# Patient Record
Sex: Female | Born: 1935 | Race: Black or African American | Hispanic: No | Marital: Single | Smoking: Former smoker
Health system: Southern US, Community
[De-identification: ages and names within clinical notes are randomized; demographics above are authoritative.]

## PROBLEM LIST (undated history)

## (undated) DIAGNOSIS — E7800 Pure hypercholesterolemia, unspecified: Secondary | ICD-10-CM

## (undated) DIAGNOSIS — R0602 Shortness of breath: Secondary | ICD-10-CM

## (undated) DIAGNOSIS — R768 Other specified abnormal immunological findings in serum: Secondary | ICD-10-CM

## (undated) DIAGNOSIS — Z86010 Personal history of colonic polyps: Secondary | ICD-10-CM

## (undated) DIAGNOSIS — N189 Chronic kidney disease, unspecified: Secondary | ICD-10-CM

## (undated) DIAGNOSIS — M1990 Unspecified osteoarthritis, unspecified site: Secondary | ICD-10-CM

## (undated) DIAGNOSIS — I714 Abdominal aortic aneurysm, without rupture, unspecified: Secondary | ICD-10-CM

## (undated) DIAGNOSIS — Z860101 Personal history of adenomatous and serrated colon polyps: Secondary | ICD-10-CM

## (undated) DIAGNOSIS — H269 Unspecified cataract: Secondary | ICD-10-CM

## (undated) DIAGNOSIS — H409 Unspecified glaucoma: Secondary | ICD-10-CM

## (undated) DIAGNOSIS — K8020 Calculus of gallbladder without cholecystitis without obstruction: Secondary | ICD-10-CM

## (undated) DIAGNOSIS — I10 Essential (primary) hypertension: Secondary | ICD-10-CM

## (undated) DIAGNOSIS — E039 Hypothyroidism, unspecified: Secondary | ICD-10-CM

## (undated) DIAGNOSIS — I6529 Occlusion and stenosis of unspecified carotid artery: Secondary | ICD-10-CM

## (undated) HISTORY — PX: APPENDECTOMY: SHX54

## (undated) HISTORY — PX: RENAL ARTERY STENT: SHX2321

## (undated) HISTORY — DX: Chronic kidney disease, unspecified: N18.9

## (undated) HISTORY — PX: EYE SURGERY: SHX253

## (undated) HISTORY — DX: Occlusion and stenosis of unspecified carotid artery: I65.29

## (undated) HISTORY — PX: COLONOSCOPY: SHX174

---

## 1973-12-05 HISTORY — PX: ABDOMINAL HYSTERECTOMY: SHX81

## 1998-06-17 ENCOUNTER — Ambulatory Visit (HOSPITAL_COMMUNITY): Admission: RE | Admit: 1998-06-17 | Discharge: 1998-06-17 | Payer: Self-pay | Admitting: *Deleted

## 1999-08-13 ENCOUNTER — Other Ambulatory Visit: Admission: RE | Admit: 1999-08-13 | Discharge: 1999-08-13 | Payer: Self-pay | Admitting: Obstetrics and Gynecology

## 2000-05-02 ENCOUNTER — Other Ambulatory Visit: Admission: RE | Admit: 2000-05-02 | Discharge: 2000-05-02 | Payer: Self-pay | Admitting: *Deleted

## 2000-05-12 ENCOUNTER — Encounter: Admission: RE | Admit: 2000-05-12 | Discharge: 2000-05-12 | Payer: Self-pay | Admitting: *Deleted

## 2000-05-12 ENCOUNTER — Encounter: Payer: Self-pay | Admitting: *Deleted

## 2000-05-15 ENCOUNTER — Ambulatory Visit (HOSPITAL_COMMUNITY): Admission: RE | Admit: 2000-05-15 | Discharge: 2000-05-15 | Payer: Self-pay | Admitting: *Deleted

## 2000-06-08 ENCOUNTER — Ambulatory Visit (HOSPITAL_COMMUNITY): Admission: RE | Admit: 2000-06-08 | Discharge: 2000-06-08 | Payer: Self-pay | Admitting: *Deleted

## 2000-06-08 ENCOUNTER — Encounter: Payer: Self-pay | Admitting: *Deleted

## 2001-07-02 ENCOUNTER — Other Ambulatory Visit: Admission: RE | Admit: 2001-07-02 | Discharge: 2001-07-02 | Payer: Self-pay | Admitting: Obstetrics and Gynecology

## 2002-07-22 ENCOUNTER — Encounter: Payer: Self-pay | Admitting: Internal Medicine

## 2002-07-22 ENCOUNTER — Encounter: Admission: RE | Admit: 2002-07-22 | Discharge: 2002-07-22 | Payer: Self-pay | Admitting: Internal Medicine

## 2002-07-25 ENCOUNTER — Other Ambulatory Visit: Admission: RE | Admit: 2002-07-25 | Discharge: 2002-07-25 | Payer: Self-pay | Admitting: Obstetrics and Gynecology

## 2004-07-21 ENCOUNTER — Other Ambulatory Visit: Admission: RE | Admit: 2004-07-21 | Discharge: 2004-07-21 | Payer: Self-pay | Admitting: Obstetrics and Gynecology

## 2005-05-26 ENCOUNTER — Encounter: Admission: RE | Admit: 2005-05-26 | Discharge: 2005-05-26 | Payer: Self-pay | Admitting: Internal Medicine

## 2005-06-18 ENCOUNTER — Emergency Department (HOSPITAL_COMMUNITY): Admission: EM | Admit: 2005-06-18 | Discharge: 2005-06-18 | Payer: Self-pay | Admitting: Emergency Medicine

## 2005-08-03 ENCOUNTER — Other Ambulatory Visit: Admission: RE | Admit: 2005-08-03 | Discharge: 2005-08-03 | Payer: Self-pay | Admitting: Obstetrics and Gynecology

## 2007-01-16 ENCOUNTER — Encounter: Admission: RE | Admit: 2007-01-16 | Discharge: 2007-01-16 | Payer: Self-pay | Admitting: Internal Medicine

## 2007-01-22 ENCOUNTER — Ambulatory Visit: Payer: Self-pay | Admitting: Vascular Surgery

## 2007-03-02 ENCOUNTER — Emergency Department (HOSPITAL_COMMUNITY): Admission: EM | Admit: 2007-03-02 | Discharge: 2007-03-02 | Payer: Self-pay | Admitting: Emergency Medicine

## 2007-03-05 ENCOUNTER — Emergency Department (HOSPITAL_COMMUNITY): Admission: EM | Admit: 2007-03-05 | Discharge: 2007-03-05 | Payer: Self-pay | Admitting: Emergency Medicine

## 2007-05-10 ENCOUNTER — Encounter: Admission: RE | Admit: 2007-05-10 | Discharge: 2007-05-10 | Payer: Self-pay | Admitting: Internal Medicine

## 2007-05-14 ENCOUNTER — Encounter: Admission: RE | Admit: 2007-05-14 | Discharge: 2007-05-14 | Payer: Self-pay | Admitting: Internal Medicine

## 2007-06-28 ENCOUNTER — Other Ambulatory Visit: Admission: RE | Admit: 2007-06-28 | Discharge: 2007-06-28 | Payer: Self-pay | Admitting: Obstetrics and Gynecology

## 2007-07-12 ENCOUNTER — Encounter: Admission: RE | Admit: 2007-07-12 | Discharge: 2007-07-12 | Payer: Self-pay | Admitting: Endocrinology

## 2007-11-14 ENCOUNTER — Encounter: Payer: Self-pay | Admitting: Internal Medicine

## 2007-12-19 ENCOUNTER — Encounter: Admission: RE | Admit: 2007-12-19 | Discharge: 2007-12-19 | Payer: Self-pay | Admitting: Internal Medicine

## 2007-12-27 ENCOUNTER — Encounter: Payer: Self-pay | Admitting: Internal Medicine

## 2007-12-27 ENCOUNTER — Ambulatory Visit: Payer: Self-pay | Admitting: Internal Medicine

## 2007-12-27 ENCOUNTER — Encounter: Admission: RE | Admit: 2007-12-27 | Discharge: 2007-12-27 | Payer: Self-pay | Admitting: Internal Medicine

## 2007-12-27 DIAGNOSIS — R0609 Other forms of dyspnea: Secondary | ICD-10-CM

## 2007-12-27 DIAGNOSIS — R0989 Other specified symptoms and signs involving the circulatory and respiratory systems: Secondary | ICD-10-CM

## 2008-02-25 ENCOUNTER — Ambulatory Visit: Payer: Self-pay | Admitting: Internal Medicine

## 2008-02-25 DIAGNOSIS — J4489 Other specified chronic obstructive pulmonary disease: Secondary | ICD-10-CM | POA: Insufficient documentation

## 2008-02-25 DIAGNOSIS — J449 Chronic obstructive pulmonary disease, unspecified: Secondary | ICD-10-CM

## 2010-06-23 ENCOUNTER — Encounter: Admission: RE | Admit: 2010-06-23 | Discharge: 2010-06-23 | Payer: Self-pay | Admitting: Internal Medicine

## 2011-11-16 ENCOUNTER — Encounter: Payer: Self-pay | Admitting: *Deleted

## 2011-11-16 ENCOUNTER — Emergency Department (HOSPITAL_COMMUNITY)
Admission: EM | Admit: 2011-11-16 | Discharge: 2011-11-16 | Disposition: A | Discharge status: Home / Self Care | Payer: Medicare Other | Attending: Emergency Medicine | Admitting: Emergency Medicine

## 2011-11-16 DIAGNOSIS — Z87891 Personal history of nicotine dependence: Secondary | ICD-10-CM | POA: Insufficient documentation

## 2011-11-16 DIAGNOSIS — I10 Essential (primary) hypertension: Secondary | ICD-10-CM | POA: Insufficient documentation

## 2011-11-16 DIAGNOSIS — Z9079 Acquired absence of other genital organ(s): Secondary | ICD-10-CM | POA: Insufficient documentation

## 2011-11-16 DIAGNOSIS — H9190 Unspecified hearing loss, unspecified ear: Secondary | ICD-10-CM | POA: Insufficient documentation

## 2011-11-16 HISTORY — DX: Essential (primary) hypertension: I10

## 2011-11-16 MED ORDER — AMOXICILLIN 500 MG PO CAPS: 500.0000 mg | ORAL_CAPSULE | Freq: Three times a day (TID) | ORAL | Status: AC

## 2011-11-16 NOTE — ED Provider Notes (Signed)
This chart was scribed for No att. providers found by [place]. The patient was seen in room APA07/APA07 and the patient's care was started at 9:50 AM.   CSN: [id number] Arrival date & time: 11/16/2011  9:13 AM   First MD Initiated Contact with Patient 11/16/11 [phone]      Chief Complaint  Patient presents with  . Hearing Loss     HPI Rebecca Tate is a 75 y.o. female who presents to the Emergency Department complaining of sudden onset hearing loss in both ears that began several days ago after she had a cold.  Pt states that the hearing loss is worse in her left ear.  Pt reports that she can hear but sounds are muffled.  Pt c/o associated dizziness. Pt denies drainage from ear, denies trauma to ears, denies vision changes.     Past Medical History  Diagnosis Date  . Hypertension     Past Surgical History  Procedure Date  . Abdominal hysterectomy     partial    History reviewed. No pertinent family history.  History  Substance Use Topics  . Smoking status: Former Games developer  . Smokeless tobacco: Not on file  . Alcohol Use: No    OB History    Grav Para Term Preterm Abortions TAB SAB Ect Mult Living                  Review of Systems 10 Systems reviewed and are negative for acute change except as noted in the HPI.  Allergies  Penicillins  Home Medications   Current Outpatient Rx  Name Route Sig Dispense Refill  . AMLODIPINE BESYLATE 10 MG PO TABS Oral Take 10 mg by mouth daily.      . ASPIRIN 81 MG PO TBEC Oral Take 81 mg by mouth daily. Swallow whole.     Marland Kitchen HYDRALAZINE HCL 25 MG PO TABS Oral Take 25 mg by mouth 2 (two) times daily.      Marland Kitchen LEVOTHYROXINE SODIUM 75 MCG PO TABS Oral Take 75 mcg by mouth daily.        BP 161/61  Pulse 77  Temp(Src) 98.7 F (37.1 C) (Oral)  Resp 18  Ht 5\' 4"  (1.626 m)  Wt 148 lb (67.132 kg)  BMI 25.40 kg/m2  SpO2 100%  Physical Exam CONSTITUTIONAL: Well developed/well nourished HEAD AND FACE:  Normocephalic/atraumatic EYES: EOMI/PERRL ENMT: Mucous membranes moist, bilateral TMs with small effusion noted, TM intact.   NECK: supple no meningeal signs CV: S1/S2 noted, no murmurs/rubs/gallops noted LUNGS: Lungs are clear to auscultation bilaterally, no apparent distress ABDOMEN: soft, nontender, no rebound or guarding GU:no cva tenderness NEURO: Pt is awake/alert, moves all extremitiesx4, facies symmetric EXTREMITIES: pulses normal, full ROM SKIN: warm, color normal PSYCH: no abnormalities of mood noted  ED Course  Procedures  DIAGNOSTIC STUDIES: Oxygen Saturation is 100% on room air, normal by my interpretation.    COORDINATION OF CARE:  Pt well appearing, recent cough, now with muffled hearing suspect has serous otitis media  MDM  Nursing notes reviewed and considered in documentation   I personally performed the services described in this documentation, which was scribed in my presence. The recorded information has been reviewed and considered.         Joya Gaskins, MD 11/16/11 [phone]

## 2011-11-16 NOTE — ED Notes (Signed)
Pt states she started out with a cold a while back and since then she has had some difficulty hearing in both ears; pt denies any pain

## 2012-01-26 ENCOUNTER — Other Ambulatory Visit: Payer: Self-pay | Admitting: Internal Medicine

## 2012-01-26 DIAGNOSIS — Z1231 Encounter for screening mammogram for malignant neoplasm of breast: Secondary | ICD-10-CM

## 2012-02-09 ENCOUNTER — Ambulatory Visit
Admission: RE | Admit: 2012-02-09 | Discharge: 2012-02-09 | Disposition: A | Discharge status: Home / Self Care | Payer: Medicare Other | Source: Ambulatory Visit | Attending: Internal Medicine | Admitting: Internal Medicine

## 2012-02-09 DIAGNOSIS — Z1231 Encounter for screening mammogram for malignant neoplasm of breast: Secondary | ICD-10-CM

## 2012-03-08 ENCOUNTER — Other Ambulatory Visit: Payer: Self-pay | Admitting: Internal Medicine

## 2012-03-09 ENCOUNTER — Other Ambulatory Visit: Payer: Medicare Other

## 2012-03-14 ENCOUNTER — Ambulatory Visit
Admission: RE | Admit: 2012-03-14 | Discharge: 2012-03-14 | Disposition: A | Discharge status: Home / Self Care | Payer: Medicare Other | Source: Ambulatory Visit | Attending: Internal Medicine | Admitting: Internal Medicine

## 2012-06-19 ENCOUNTER — Encounter (HOSPITAL_COMMUNITY): Payer: Self-pay | Admitting: *Deleted

## 2012-06-20 ENCOUNTER — Ambulatory Visit (HOSPITAL_COMMUNITY)
Admission: RE | Admit: 2012-06-20 | Discharge: 2012-06-20 | Disposition: A | Discharge status: Home / Self Care | Payer: Medicare Other | Source: Ambulatory Visit | Attending: Gastroenterology | Admitting: Gastroenterology

## 2012-06-20 ENCOUNTER — Encounter (HOSPITAL_COMMUNITY): Payer: Self-pay | Admitting: Anesthesiology

## 2012-06-20 ENCOUNTER — Ambulatory Visit (HOSPITAL_COMMUNITY): Payer: Medicare Other | Admitting: Anesthesiology

## 2012-06-20 ENCOUNTER — Encounter (HOSPITAL_COMMUNITY)
Admission: RE | Disposition: A | Discharge status: Home / Self Care | Payer: Self-pay | Source: Ambulatory Visit | Attending: Gastroenterology

## 2012-06-20 DIAGNOSIS — K8020 Calculus of gallbladder without cholecystitis without obstruction: Secondary | ICD-10-CM | POA: Insufficient documentation

## 2012-06-20 DIAGNOSIS — E039 Hypothyroidism, unspecified: Secondary | ICD-10-CM | POA: Insufficient documentation

## 2012-06-20 DIAGNOSIS — R110 Nausea: Secondary | ICD-10-CM | POA: Insufficient documentation

## 2012-06-20 DIAGNOSIS — J449 Chronic obstructive pulmonary disease, unspecified: Secondary | ICD-10-CM | POA: Insufficient documentation

## 2012-06-20 DIAGNOSIS — I10 Essential (primary) hypertension: Secondary | ICD-10-CM | POA: Insufficient documentation

## 2012-06-20 DIAGNOSIS — J4489 Other specified chronic obstructive pulmonary disease: Secondary | ICD-10-CM | POA: Insufficient documentation

## 2012-06-20 DIAGNOSIS — R7989 Other specified abnormal findings of blood chemistry: Secondary | ICD-10-CM | POA: Insufficient documentation

## 2012-06-20 HISTORY — PX: EUS: SHX5427

## 2012-06-20 HISTORY — DX: Unspecified glaucoma: H40.9

## 2012-06-20 HISTORY — DX: Hypothyroidism, unspecified: E03.9

## 2012-06-20 HISTORY — DX: Other specified abnormal immunological findings in serum: R76.8

## 2012-06-20 HISTORY — DX: Personal history of colonic polyps: Z86.010

## 2012-06-20 HISTORY — DX: Abdominal aortic aneurysm, without rupture: I71.4

## 2012-06-20 HISTORY — DX: Personal history of adenomatous and serrated colon polyps: Z86.0101

## 2012-06-20 HISTORY — DX: Calculus of gallbladder without cholecystitis without obstruction: K80.20

## 2012-06-20 HISTORY — DX: Pure hypercholesterolemia, unspecified: E78.00

## 2012-06-20 HISTORY — DX: Abdominal aortic aneurysm, without rupture, unspecified: I71.40

## 2012-06-20 HISTORY — DX: Unspecified cataract: H26.9

## 2012-06-20 SURGERY — ESOPHAGEAL ENDOSCOPIC ULTRASOUND (EUS) RADIAL
Anesthesia: Monitor Anesthesia Care

## 2012-06-20 MED ORDER — MIDAZOLAM HCL 5 MG/5ML IJ SOLN
INTRAMUSCULAR | Status: DC | PRN
  Administered 2012-06-20: 1 mg via INTRAVENOUS

## 2012-06-20 MED ORDER — SODIUM CHLORIDE 0.9 % IV SOLN: Freq: Once | INTRAVENOUS | Status: DC

## 2012-06-20 MED ORDER — PROPOFOL 10 MG/ML IV EMUL
INTRAVENOUS | Status: DC | PRN
  Administered 2012-06-20: 160 ug/kg/min via INTRAVENOUS

## 2012-06-20 MED ORDER — LACTATED RINGERS IV SOLN
INTRAVENOUS | Status: DC
  Administered 2012-06-20: 1000 mL via INTRAVENOUS

## 2012-06-20 MED ORDER — FENTANYL CITRATE 0.05 MG/ML IJ SOLN
INTRAMUSCULAR | Status: DC | PRN
  Administered 2012-06-20 (×2): 25 ug via INTRAVENOUS

## 2012-06-20 MED ORDER — ONDANSETRON HCL 4 MG/2ML IJ SOLN
INTRAMUSCULAR | Status: DC | PRN
  Administered 2012-06-20: 4 mg via INTRAVENOUS

## 2012-06-20 MED ORDER — LIDOCAINE HCL (CARDIAC) 20 MG/ML IV SOLN
INTRAVENOUS | Status: DC | PRN
  Administered 2012-06-20: 50 mg via INTRAVENOUS

## 2012-06-20 MED ORDER — KETAMINE HCL 50 MG/ML IJ SOLN
INTRAMUSCULAR | Status: DC | PRN
  Administered 2012-06-20 (×4): 5 mg via INTRAMUSCULAR

## 2012-06-20 NOTE — Preoperative (Signed)
Beta Blockers   Reason not to administer Beta Blockers:Not Applicable, not on home BB 

## 2012-06-20 NOTE — Progress Notes (Signed)
Pt calm and appropriate.  VSS.  Sister at bedside.

## 2012-06-20 NOTE — H&P (Signed)
Patient interval history reviewed.  Patient examined again.  There has been no change from documented H/P dated 05/31/12 (scanned into chart from our office) except as documented above.  Assessment:  Elevated LFTs. Gallstones. Transabdominal ultrasound recently with gallstones and non-dilated biliary tree.  Plan:  1.  Endoscopic ultrasound to exclude choledocholithiasis or other source of elevated LFTs. 2.  Risks (bleeding, infection, bowel perforation that could require surgery, sedation-related changes in cardiopulmonary systems), benefits (identification and possible treatment of source of symptoms, exclusion of certain causes of symptoms), and alternatives (watchful waiting, radiographic imaging studies, empiric medical treatment) of upper endoscopy with ultrasound (EUS) were explained to patient in detail and she elects  to proceed.

## 2012-06-20 NOTE — Progress Notes (Signed)
Pt arrived in recovery.  Denying pain, but moaning out.  Adjusted in bed.  Maintaining airway and stable vital signs.  CRNA stated she began moaning with initial sedating medications during procedure.  Able to verbalize appropriately to simple questions.  Moving extremities purposefully.  Keeps eyes closed and moans.

## 2012-06-20 NOTE — Anesthesia Preprocedure Evaluation (Addendum)
Anesthesia Evaluation  Patient identified by MRN, date of birth, ID band Patient awake    Reviewed: Allergy & Precautions, H&P , NPO status , Patient's Chart, lab work & pertinent test results  Airway Mallampati: II TM Distance: >3 FB Neck ROM: full    Dental  (+) Edentulous Upper and Dental Advisory Given   Pulmonary neg pulmonary ROS, shortness of breath and with exertion, COPD Mild COPD breath sounds clear to auscultation  Pulmonary exam normal       Cardiovascular Exercise Tolerance: Good hypertension, Pt. on medications negative cardio ROS  Rhythm:regular Rate:Normal  AAA 3.9 cm   Neuro/Psych negative neurological ROS  negative psych ROS   GI/Hepatic negative GI ROS, Neg liver ROS,   Endo/Other  negative endocrine ROSHypothyroidism   Renal/GU negative Renal ROS  negative genitourinary   Musculoskeletal   Abdominal   Peds  Hematology negative hematology ROS (+)   Anesthesia Other Findings   Reproductive/Obstetrics negative OB ROS                          Anesthesia Physical Anesthesia Plan  ASA: III  Anesthesia Plan: MAC   Post-op Pain Management:    Induction:   Airway Management Planned: Simple Face Mask  Additional Equipment:   Intra-op Plan:   Post-operative Plan:   Informed Consent: I have reviewed the patients History and Physical, chart, labs and discussed the procedure including the risks, benefits and alternatives for the proposed anesthesia with the patient or authorized representative who has indicated his/her understanding and acceptance.   Dental Advisory Given  Plan Discussed with: CRNA and Surgeon  Anesthesia Plan Comments:         Anesthesia Quick Evaluation

## 2012-06-20 NOTE — Op Note (Signed)
[facility] [address] [place], [place]  [address]  ENDOSCOPIC ULTRASOUND PROCEDURE REPORT  PATIENT:  Rebecca, Tate  MR#:  [id number] BIRTHDATE:  1936/10/13  GENDER:  female  ENDOSCOPIST:  Willis Modena, MD REFERRED BY:  Danise Edge, M.D.  PROCEDURE DATE:  06/20/2012 PROCEDURE:  Upper EUS ASA CLASS:  Class III INDICATIONS:  elevated LFTs, nausea  MEDICATIONS:   Cetacaine spray x 2, MAC sedation, administered by CRNA  DESCRIPTION OF PROCEDURE:   After the risks benefits and alternatives of the procedure were  explained, informed consent was obtained. The patient was then placed in the left, lateral, decubitus postion and IV sedation was administered. Throughout the procedure, the patient's blood pressure, pulse and oxygen saturations were monitored continuously.  Under direct visualization, the 110036 endoscope was introduced through the mouth and advanced to the second portion of the duodenum.  Water was used as necessary to provide an acoustic interface.  Upon completion of the imaging, water was removed and the patient was sent to the recovery room in satisfactory condition.  <<PROCEDUREIMAGES>>  FINDINGS:  Scattered hyperechoic strands and foci, otherwise normal pancreas; no mass, cyst or adenopathy  appreciated.  Bile duct normal caliber without bile duct stones or wall thickening. Gallbladder with some sludge, small stones, and a large stone   2.5 cm in diameter.  Abdominal aortic aneurysm appreciated and obscured some views of uncinate pancreas.  ENDOSCOPIC IMPRESSION:    1.  No pancreatic or biliary tract source of elevated LFTs identified. 2.  Incidental findings as above.  RECOMMENDATIONS:      1.  Watch for potential complications of procedure. 2.  Follow-up with Dr. Danise Edge for consideration of liver biopsy as next step in evaluation             of patient's elevated liver tests.  ______________________________ Willis Modena  CC:  n. eSIGNEDWillis Modena at 06/20/2012 02:05 PM  Ruben Gottron, [phone]

## 2012-06-20 NOTE — Transfer of Care (Signed)
Immediate Anesthesia Transfer of Care Note  Patient: Rebecca Tate  Procedure(s) Performed: Procedure(s) (LRB): ESOPHAGEAL ENDOSCOPIC ULTRASOUND (EUS) RADIAL (N/A)  Patient Location: PACU and Endoscopy Unit  Anesthesia Type: MAC  Level of Consciousness: awake, patient cooperative and responds to stimulation  Airway & Oxygen Therapy: Patient Spontanous Breathing and Patient connected to face mask  Post-op Assessment: Report given to PACU RN, Post -op Vital signs reviewed and stable and Patient moving all extremities  Post vital signs: Reviewed and stable  Complications: No apparent anesthesia complications

## 2012-06-22 NOTE — Anesthesia Postprocedure Evaluation (Signed)
  Anesthesia Post-op Note  Patient: Rebecca Tate  Procedure(s) Performed: Procedure(s) (LRB): ESOPHAGEAL ENDOSCOPIC ULTRASOUND (EUS) RADIAL (N/A)  Patient Location: PACU  Anesthesia Type: MAC  Level of Consciousness: awake and alert   Airway and Oxygen Therapy: Patient Spontanous Breathing  Post-op Pain: mild  Post-op Assessment: Post-op Vital signs reviewed, Patient's Cardiovascular Status Stable, Respiratory Function Stable, Patent Airway and No signs of Nausea or vomiting  Post-op Vital Signs: stable  Complications: No apparent anesthesia complications

## 2012-06-25 ENCOUNTER — Encounter (HOSPITAL_COMMUNITY): Payer: Self-pay | Admitting: Gastroenterology

## 2012-08-10 ENCOUNTER — Other Ambulatory Visit: Payer: Self-pay

## 2012-08-10 DIAGNOSIS — G458 Other transient cerebral ischemic attacks and related syndromes: Secondary | ICD-10-CM

## 2012-08-10 DIAGNOSIS — R0989 Other specified symptoms and signs involving the circulatory and respiratory systems: Secondary | ICD-10-CM

## 2012-08-10 DIAGNOSIS — N3030 Trigonitis without hematuria: Secondary | ICD-10-CM

## 2012-08-10 DIAGNOSIS — I10 Essential (primary) hypertension: Secondary | ICD-10-CM

## 2012-08-31 ENCOUNTER — Encounter: Payer: Self-pay | Admitting: Surgery

## 2012-09-03 ENCOUNTER — Other Ambulatory Visit (INDEPENDENT_AMBULATORY_CARE_PROVIDER_SITE_OTHER): Payer: Medicare Other | Admitting: *Deleted

## 2012-09-03 ENCOUNTER — Encounter (INDEPENDENT_AMBULATORY_CARE_PROVIDER_SITE_OTHER): Payer: Medicare Other | Admitting: *Deleted

## 2012-09-03 ENCOUNTER — Encounter: Payer: Self-pay | Admitting: Surgery

## 2012-09-03 ENCOUNTER — Ambulatory Visit (INDEPENDENT_AMBULATORY_CARE_PROVIDER_SITE_OTHER): Payer: Medicare Other | Admitting: Surgery

## 2012-09-03 ENCOUNTER — Other Ambulatory Visit: Payer: Self-pay

## 2012-09-03 VITALS — BP 90/65 | HR 60 | Resp 16 | Ht 64.5 in | Wt 138.8 lb

## 2012-09-03 DIAGNOSIS — G458 Other transient cerebral ischemic attacks and related syndromes: Secondary | ICD-10-CM

## 2012-09-03 DIAGNOSIS — I10 Essential (primary) hypertension: Secondary | ICD-10-CM

## 2012-09-03 DIAGNOSIS — R0989 Other specified symptoms and signs involving the circulatory and respiratory systems: Secondary | ICD-10-CM

## 2012-09-03 NOTE — Progress Notes (Signed)
[facility] [place]   Patient name: Rebecca Tate MRN: [id number] DOB: 01/08/1936 Sex: female   Referred by: Dr. Coladonato  Reason for referral:  Chief Complaint  Patient presents with  . New Evaluation    Renal Artery , Carotid Bruit and Left Femoral Bruit. Ref. by Dr. J. Coladonato    HISTORY OF PRESENT ILLNESS: The patient comes in today for evaluation of multiple bruits, and a vascular evaluation. She has a non-small abdominal aortic aneurysm. She has not left subclavian artery stenosis which is asymptomatic. She does have a discrepancy between the blood pressures in her right and left arm. During a recent examination she is found to have carotid abdominal bruits. She denies any neurovascular symptoms. Specifically, she denies numbness or weakness in either extremity. She denies slurred speech. She denies amaurosis fugax. She does have a history of hypertension and renal insufficiency. She has a history of tobacco abuse but quit 3-1/2 years ago.  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  Past Medical History  Diagnosis Date  . Hypertension   . Hypothyroidism     thyroid nodule  . Hypercholesteremia   . Cataracts, bilateral   . Hx of adenomatous colonic polyps   . AAA (abdominal aortic aneurysm)   . Gallstones   . Glaucoma   . ANA positive   . Carotid artery occlusion   . Chronic kidney disease     Past Surgical History  Procedure Date  . Appendectomy   . Colonoscopy   . Eus 06/20/2012    Procedure: ESOPHAGEAL ENDOSCOPIC ULTRASOUND (EUS) RADIAL;  Surgeon: William Outlaw, MD;  Location: [facility] ENDOSCOPY;  Service: Endoscopy;  Laterality: N/A;  . Abdominal hysterectomy 1975    partial    History   Social History  . Marital Status: Single    Spouse Name: N/A    Number of Children: N/A  . Years of Education: N/A   Occupational History  . Not on file.   Social History Main Topics  . Smoking status: Former  Smoker -- 0.5 packs/day    Types: Cigarettes    Quit date: 12/05/2008  . Smokeless tobacco: Not on file  . Alcohol Use: No  . Drug Use: No  . Sexually Active:    Other Topics Concern  . Not on file   Social History Narrative  . No narrative on file    Family History  Problem Relation Age of Onset  . Heart disease Mother   . Hypertension Mother   . Heart attack Father   . Diabetes Sister   . Hypertension Sister   . Diabetes Brother     Amputation  . Heart disease Brother   . Hypertension Brother     Allergies as of 09/03/2012 - Review Complete 09/03/2012  Allergen Reaction Noted  . Hydralazine Nausea And Vomiting 09/03/2012  . Penicillins      Current Outpatient Prescriptions on File Prior to Visit  Medication Sig Dispense Refill  . amLODipine (NORVASC) 10 MG tablet Take 10 mg by mouth daily.        . aspirin (ASPIRIN EC) 81 MG EC tablet Take 81 mg by mouth daily. Swallow whole.       . cholecalciferol (VITAMIN D) 1000 UNITS tablet Take 1,000 Units by mouth daily.      . latanoprost (XALATAN) 0.005 % ophthalmic solution 1 drop at bedtime.      . levothyroxine (SYNTHROID, LEVOTHROID) 75 MCG tablet Take 75 mcg by mouth   daily.        . metoprolol succinate (TOPROL-XL) 25 MG 24 hr tablet       . hydrALAZINE (APRESOLINE) 25 MG tablet Take 25 mg by mouth 2 (two) times daily.           REVIEW OF SYSTEMS: Cardiovascular: No chest pain, chest pressure, palpitations,No claudication or rest pain,  No history of DVT or phlebitis. She does get cramps in her left leg at night, and she has leg swelling Pulmonary: No productive cough, asthma or wheezing. Positive for shortness of breath with exertion. Neurologic: No weakness, paresthesias, aphasia, or amaurosis. No dizziness. Hematologic: No bleeding problems or clotting disorders. Musculoskeletal: No joint pain or joint swelling. Gastrointestinal: No blood in stool or hematemesis Genitourinary: No dysuria or  hematuria. Psychiatric:: No history of major depression. Integumentary: No rashes or ulcers. Constitutional: No fever or chills.  PHYSICAL EXAMINATION: General: The patient appears their stated age.  Vital signs are BP 90/65  Pulse 60  Resp 16  Ht 5' 4.5" (1.638 m)  Wt 138 lb 12.8 oz (62.959 kg)  BMI 23.46 kg/m2  SpO2 100% HEENT:  No gross abnormalities Pulmonary: Respirations are non-labored Abdomen: Soft and non-tender. Musculoskeletal: There are no major deformities.   Neurologic: No focal weakness or paresthesias are detected, Skin: There are no ulcer or rashes noted. Psychiatric: The patient has normal affect. Cardiovascular: There is a regular rate and rhythm without significant murmur appreciated. Palpable femoral pulses bilaterally. I can palpate a left posterior tibial. Faint carotid bruits bilaterally  Diagnostic Studies: Carotid ultrasound: 1-39% right carotid stenosis, 40-59% left carotid stenosis. Significant blood pressure gradient. The left was 160, the right was 91. The vertebral artery on the left could not be visualized.  Abdominal ultrasound: Suspect greater than 60% stenosis of the left renal artery. Resistive indices could not be calculated due to 2 poor visualization of the profunda renal parenchyma. The right kidney measures 7.9 cm. The left kidney measures 8.98 cm. The patient has a 3.7 cm abdominal aortic aneurysm. There are elevated velocities within the right common iliac artery. Peak systolic velocity was 389.  Ankle-brachial indices: 1.0 on the left with triphasic waveforms. 0.84 on the right with biphasic waveforms  Outside Studies/Documentation Historical records were reviewed.  They showed multiple bruits  Assessment:  #1: Abdominal aortic aneurysm #2: Peripheral vascular disease #3: Renal artery stenosis #4: carotid artery stenosis  Plan: #1: Abdominal aortic aneurysm - continue to follow this on a yearly basis. I discussed with the patient at  this size there is a small risk of rupture, but unlikely. #2: Peripheral vascular disease - the patient does have right common iliac stenosis, however this appears to be asymptomatic. I discussed the signs and symptoms of claudication. Should the patient develop these we could address this at a later date. #3: Renal artery stenosis  - the ultrasound today was limited do to the difficulty in evaluating her kidneys. However, there is a suspected left renal artery stenosis. It is not clear at this time as to whether this is in the midportion of the renal artery, out in the hilum or at the ostium. Because of the patient's worsening renal function and hypertension I feel it is reasonable to proceed with angiographic evaluation and possible intervention should a significant stenosis be identified. Because of her elevated creatinine I will to the study with CO2. I potentially did have to use a small amount of dye, 10-15 mL. This is been scheduled for Tuesday, October 15.   I will start bicarbonate upon her arrival the morning of her procedure. I also told patient that she may require an overnight admission following her procedure depending on amount of contrast utilized. #4: Carotid artery stenosis - The patient is asymptomatic. I'll follow her carotid artery stenosis with ultrasound in one year.     V. Wells Stephon Weathers IV, M.D. [facility] [place] Office: [phone] Pager:  [phone]   

## 2012-09-07 ENCOUNTER — Encounter (HOSPITAL_COMMUNITY): Payer: Self-pay | Admitting: Pharmacy Technician

## 2012-09-17 MED ORDER — SODIUM CHLORIDE 0.9 % IV SOLN: INTRAVENOUS | Status: DC

## 2012-09-18 ENCOUNTER — Ambulatory Visit (HOSPITAL_COMMUNITY)
Admission: RE | Admit: 2012-09-18 | Discharge: 2012-09-19 | Disposition: A | Discharge status: Home / Self Care | Payer: Medicare Other | Source: Ambulatory Visit | Attending: Surgery | Admitting: Surgery

## 2012-09-18 ENCOUNTER — Encounter (HOSPITAL_COMMUNITY)
Admission: RE | Disposition: A | Discharge status: Home / Self Care | Payer: Self-pay | Source: Ambulatory Visit | Attending: Surgery

## 2012-09-18 DIAGNOSIS — Z860100 Personal history of colon polyps, unspecified: Secondary | ICD-10-CM | POA: Insufficient documentation

## 2012-09-18 DIAGNOSIS — E785 Hyperlipidemia, unspecified: Secondary | ICD-10-CM | POA: Insufficient documentation

## 2012-09-18 DIAGNOSIS — I714 Abdominal aortic aneurysm, without rupture, unspecified: Secondary | ICD-10-CM | POA: Insufficient documentation

## 2012-09-18 DIAGNOSIS — N189 Chronic kidney disease, unspecified: Secondary | ICD-10-CM | POA: Insufficient documentation

## 2012-09-18 DIAGNOSIS — R0989 Other specified symptoms and signs involving the circulatory and respiratory systems: Secondary | ICD-10-CM

## 2012-09-18 DIAGNOSIS — H409 Unspecified glaucoma: Secondary | ICD-10-CM | POA: Insufficient documentation

## 2012-09-18 DIAGNOSIS — H269 Unspecified cataract: Secondary | ICD-10-CM | POA: Insufficient documentation

## 2012-09-18 DIAGNOSIS — I701 Atherosclerosis of renal artery: Secondary | ICD-10-CM

## 2012-09-18 DIAGNOSIS — Z7982 Long term (current) use of aspirin: Secondary | ICD-10-CM | POA: Insufficient documentation

## 2012-09-18 DIAGNOSIS — E039 Hypothyroidism, unspecified: Secondary | ICD-10-CM | POA: Insufficient documentation

## 2012-09-18 DIAGNOSIS — I6529 Occlusion and stenosis of unspecified carotid artery: Secondary | ICD-10-CM | POA: Insufficient documentation

## 2012-09-18 DIAGNOSIS — I708 Atherosclerosis of other arteries: Secondary | ICD-10-CM | POA: Insufficient documentation

## 2012-09-18 DIAGNOSIS — K8020 Calculus of gallbladder without cholecystitis without obstruction: Secondary | ICD-10-CM | POA: Insufficient documentation

## 2012-09-18 DIAGNOSIS — Z79899 Other long term (current) drug therapy: Secondary | ICD-10-CM | POA: Insufficient documentation

## 2012-09-18 DIAGNOSIS — I129 Hypertensive chronic kidney disease with stage 1 through stage 4 chronic kidney disease, or unspecified chronic kidney disease: Secondary | ICD-10-CM | POA: Insufficient documentation

## 2012-09-18 HISTORY — PX: RENAL ANGIOGRAM: SHX5509

## 2012-09-18 LAB — POCT ACTIVATED CLOTTING TIME
Activated Clotting Time: 199 seconds
Activated Clotting Time: 179 seconds

## 2012-09-18 LAB — POCT I-STAT, CHEM 8
Calcium, Ion: 1.21 mmol/L (ref 1.13–1.30)
Chloride: 113 mEq/L — ABNORMAL HIGH (ref 96–112)
HCT: 43 % (ref 36.0–46.0)
Hemoglobin: 14.6 g/dL (ref 12.0–15.0)
TCO2: 24 mmol/L (ref 0–100)

## 2012-09-18 SURGERY — RENAL ANGIOGRAM
Anesthesia: LOCAL

## 2012-09-18 MED ORDER — AMLODIPINE BESYLATE 10 MG PO TABS
10.0000 mg | ORAL_TABLET | Freq: Every day | ORAL | Status: DC
  Filled 2012-09-18 (×2): qty 1

## 2012-09-18 MED ORDER — ACETAMINOPHEN 325 MG PO TABS: 325.0000 mg | ORAL_TABLET | ORAL | Status: DC | PRN

## 2012-09-18 MED ORDER — HEPARIN SODIUM (PORCINE) 1000 UNIT/ML IJ SOLN
INTRAMUSCULAR | Status: AC
  Filled 2012-09-18: qty 1

## 2012-09-18 MED ORDER — HEPARIN (PORCINE) IN NACL 2-0.9 UNIT/ML-% IJ SOLN
INTRAMUSCULAR | Status: AC
  Filled 2012-09-18: qty 500

## 2012-09-18 MED ORDER — DEXTROSE 5 % IV SOLN
INTRAVENOUS | Status: DC
  Filled 2012-09-18: qty 1000

## 2012-09-18 MED ORDER — LIDOCAINE HCL (PF) 1 % IJ SOLN
INTRAMUSCULAR | Status: AC
  Filled 2012-09-18: qty 30

## 2012-09-18 MED ORDER — SODIUM BICARBONATE 8.4 % IV SOLN
INTRAVENOUS | Status: DC
  Filled 2012-09-18: qty 1000

## 2012-09-18 MED ORDER — OXYCODONE-ACETAMINOPHEN 5-325 MG PO TABS: 1.0000 | ORAL_TABLET | ORAL | Status: DC | PRN

## 2012-09-18 MED ORDER — METOPROLOL TARTRATE 1 MG/ML IV SOLN: 2.0000 mg | INTRAVENOUS | Status: DC | PRN

## 2012-09-18 MED ORDER — METOPROLOL SUCCINATE ER 25 MG PO TB24
25.0000 mg | ORAL_TABLET | Freq: Every day | ORAL | Status: DC
  Filled 2012-09-18: qty 1

## 2012-09-18 MED ORDER — ASPIRIN EC 81 MG PO TBEC
81.0000 mg | DELAYED_RELEASE_TABLET | Freq: Every day | ORAL | Status: DC
  Administered 2012-09-18 – 2012-09-19 (×2): 81 mg via ORAL
  Filled 2012-09-18 (×2): qty 1

## 2012-09-18 MED ORDER — PHENOL 1.4 % MT LIQD
1.0000 | OROMUCOSAL | Status: DC | PRN
  Filled 2012-09-18: qty 177

## 2012-09-18 MED ORDER — ALUM & MAG HYDROXIDE-SIMETH 200-200-20 MG/5ML PO SUSP: 15.0000 mL | ORAL | Status: DC | PRN

## 2012-09-18 MED ORDER — ONDANSETRON HCL 4 MG/2ML IJ SOLN: 4.0000 mg | Freq: Four times a day (QID) | INTRAMUSCULAR | Status: DC | PRN

## 2012-09-18 MED ORDER — CLONIDINE HCL 0.2 MG PO TABS
0.2000 mg | ORAL_TABLET | ORAL | Status: DC | PRN
Start: 2012-09-18 — End: 2012-09-19
  Filled 2012-09-18: qty 1

## 2012-09-18 MED ORDER — FENTANYL CITRATE 0.05 MG/ML IJ SOLN
INTRAMUSCULAR | Status: AC
  Filled 2012-09-18: qty 2

## 2012-09-18 MED ORDER — LEVOTHYROXINE SODIUM 75 MCG PO TABS
75.0000 ug | ORAL_TABLET | Freq: Every day | ORAL | Status: DC
  Filled 2012-09-18 (×2): qty 1

## 2012-09-18 MED ORDER — FENTANYL CITRATE 0.05 MG/ML IJ SOLN: 25.0000 ug | INTRAMUSCULAR | Status: DC | PRN

## 2012-09-18 MED ORDER — VITAMIN D3 25 MCG (1000 UNIT) PO TABS
1000.0000 [IU] | ORAL_TABLET | Freq: Every day | ORAL | Status: DC
  Filled 2012-09-18: qty 1

## 2012-09-18 MED ORDER — ONDANSETRON HCL 4 MG/2ML IJ SOLN
INTRAMUSCULAR | Status: AC
  Filled 2012-09-18: qty 2

## 2012-09-18 MED ORDER — SODIUM CHLORIDE 0.9 % IV SOLN: 1.0000 mL/kg/h | INTRAVENOUS | Status: DC

## 2012-09-18 MED ORDER — MORPHINE SULFATE 2 MG/ML IJ SOLN: 2.0000 mg | INTRAMUSCULAR | Status: DC | PRN

## 2012-09-18 MED ORDER — SODIUM CHLORIDE 0.9 % IV SOLN
INTRAVENOUS | Status: DC
  Administered 2012-09-18: 20:00:00 via INTRAVENOUS

## 2012-09-18 MED ORDER — LACTATED RINGERS IV SOLN: INTRAVENOUS | Status: DC

## 2012-09-18 MED ORDER — EZETIMIBE 10 MG PO TABS
10.0000 mg | ORAL_TABLET | Freq: Every day | ORAL | Status: DC
  Filled 2012-09-18: qty 1

## 2012-09-18 MED ORDER — ACETAMINOPHEN 650 MG RE SUPP: 325.0000 mg | RECTAL | Status: DC | PRN

## 2012-09-18 MED ORDER — GUAIFENESIN-DM 100-10 MG/5ML PO SYRP: 15.0000 mL | ORAL_SOLUTION | ORAL | Status: DC | PRN

## 2012-09-18 MED ORDER — MIDAZOLAM HCL 2 MG/2ML IJ SOLN
INTRAMUSCULAR | Status: AC
  Filled 2012-09-18: qty 2

## 2012-09-18 MED ORDER — DORZOLAMIDE HCL 2 % OP SOLN
1.0000 [drp] | Freq: Two times a day (BID) | OPHTHALMIC | Status: DC
  Filled 2012-09-18: qty 10

## 2012-09-18 MED ORDER — SODIUM BICARBONATE 8.4 % IV SOLN
INTRAVENOUS | Status: DC
  Filled 2012-09-18 (×2): qty 1000

## 2012-09-18 NOTE — Op Note (Signed)
[facility]  Patient name: Rebecca Tate MRN: [id number] DOB: 02/01/1936 Sex: female  09/18/2012 Pre-operative Diagnosis: Renal artery stenosis Post-operative diagnosis:  Same Surgeon:  Jorge Ny Procedure Performed:  1.  ultrasound access right femoral artery  2.  abdominal aortogram  3.  first order catheterization (left renal artery)  4.  left renal artery angiogram     Indications:  The patient was referred by nephrology for worsening renal function and hypertension. She has multiple abdominal bruits on examination. Ultrasound identified what was thought to be a stenosis in the left renal artery. She comes in today for further evaluation and possible treatment  Procedure:  The patient was identified in the holding area and taken to room 8.  The patient was then placed supine on the table and prepped and draped in the usual sterile fashion.  A time out was called.  Ultrasound was used to evaluate the right common femoral artery.  It was patent .  A digital ultrasound image was acquired.  The right common femoral artery was accessed under ultrasound guidance with an 18-gauge needle. A 035 wire was advanced into the iliac system under fluoroscopic visualization. A 6 French sheath was placed. I had to use a KM P. catheter to navigate the wire into the abdominal aorta. A Omni flush catheter was advanced to the level of L1 and an abdominal aortogram was obtained using CO2. Next I was able to advance the Omni flush catheter into the left renal artery. There was a significant pressure gradient. Contrast injections were performed with the catheter was beyond the stenosis. I elected to primarily stented this. I had extreme difficulty obtaining a stable platform for stenting. I used a renal double curve guide catheter, a high and a guide catheter, both of these were not successful. I then tried to advance a sheath over the Omni flush catheter which was supported  by a Pollyann Kennedy wire. Again I could not find a stable platform. I ultimately felt that this was not something that could be done from the groin and therefore I elected to abort the procedure. My plan is to come from the arm however the patient was fully heparinized and therefore I will do this tomorrow.  Findings:   Aortogram:  The visualized portions of the suprarenal abdominal aorta showed no significant disease. The right renal artery appears to be patent at its ostium. There is a high-grade stenosis at the origin of the left renal artery. Aneurysmal changes are noted within the infrarenal abdominal aorta. There is a stenosis within the proximal right common iliac artery.  Left renal artery:  The distal portion of the left renal artery is widely patent. The proximal artery could not be evaluated with contrast however there was a significant pressure gradient of approximately 80 mm mercury.    Impression:  #1  infrarenal abdominal aortic aneurysm  #2  right common iliac stenosis  #3  high-grade ostial left renal artery stenosis which could not be stented with groin access. I plan on treating this from the arm tomorrow.  Juleen China, M.D. [facility] [place] Office: [phone] Pager:  [phone]

## 2012-09-18 NOTE — H&P (View-Only) (Signed)
Vascular and Vein Specialist of [facility] ([place])   Patient name: Rebecca Tate MRN: [id number] DOB: [birth year] Sex: female   Referred by: Dr. Arrie Aran  Reason for referral:  Chief Complaint  Patient presents with  . New Evaluation    Renal Artery , Carotid Bruit and Left Femoral Bruit. Ref. by Dr. Hansel Feinstein    HISTORY OF PRESENT ILLNESS: The patient comes in today for evaluation of multiple bruits, and a vascular evaluation. She has a non-small abdominal aortic aneurysm. She has not left subclavian artery stenosis which is asymptomatic. She does have a discrepancy between the blood pressures in her right and left arm. During a recent examination she is found to have carotid abdominal bruits. She denies any neurovascular symptoms. Specifically, she denies numbness or weakness in either extremity. She denies slurred speech. She denies amaurosis fugax. She does have a history of hypertension and renal insufficiency. She has a history of tobacco abuse but quit 3-1/2 years ago.  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  Past Medical History  Diagnosis Date  . Hypertension   . Hypothyroidism     thyroid nodule  . Hypercholesteremia   . Cataracts, bilateral   . Hx of adenomatous colonic polyps   . AAA (abdominal aortic aneurysm)   . Gallstones   . Glaucoma   . ANA positive   . Carotid artery occlusion   . Chronic kidney disease     Past Surgical History  Procedure Date  . Appendectomy   . Colonoscopy   . Eus 06/20/2012    Procedure: ESOPHAGEAL ENDOSCOPIC ULTRASOUND (EUS) RADIAL;  Surgeon: Willis Modena, MD;  Location: [facility];  Service: Endoscopy;  Laterality: N/A;  . Abdominal hysterectomy 1975    partial    History   Social History  . Marital Status: Single    Spouse Name: N/A    Number of Children: N/A  . Years of Education: N/A   Occupational History  . Not on file.   Social History Main Topics  . Smoking status: Former  Smoker -- 0.5 packs/day    Types: Cigarettes    Quit date: 12/05/2008  . Smokeless tobacco: Not on file  . Alcohol Use: No  . Drug Use: No  . Sexually Active:    Other Topics Concern  . Not on file   Social History Narrative  . No narrative on file    Family History  Problem Relation Age of Onset  . Heart disease Mother   . Hypertension Mother   . Heart attack Father   . Diabetes Sister   . Hypertension Sister   . Diabetes Brother     Amputation  . Heart disease Brother   . Hypertension Brother     Allergies as of 09/03/2012 - Review Complete 09/03/2012  Allergen Reaction Noted  . Hydralazine Nausea And Vomiting 09/03/2012  . Penicillins      Current Outpatient Prescriptions on File Prior to Visit  Medication Sig Dispense Refill  . amLODipine (NORVASC) 10 MG tablet Take 10 mg by mouth daily.        Marland Kitchen aspirin (ASPIRIN EC) 81 MG EC tablet Take 81 mg by mouth daily. Swallow whole.       . cholecalciferol (VITAMIN D) 1000 UNITS tablet Take 1,000 Units by mouth daily.      Marland Kitchen latanoprost (XALATAN) 0.005 % ophthalmic solution 1 drop at bedtime.      Marland Kitchen levothyroxine (SYNTHROID, LEVOTHROID) 75 MCG tablet Take 75 mcg by mouth  daily.        . metoprolol succinate (TOPROL-XL) 25 MG 24 hr tablet       . hydrALAZINE (APRESOLINE) 25 MG tablet Take 25 mg by mouth 2 (two) times daily.           REVIEW OF SYSTEMS: Cardiovascular: No chest pain, chest pressure, palpitations,No claudication or rest pain,  No history of DVT or phlebitis. She does get cramps in her left leg at night, and she has leg swelling Pulmonary: No productive cough, asthma or wheezing. Positive for shortness of breath with exertion. Neurologic: No weakness, paresthesias, aphasia, or amaurosis. No dizziness. Hematologic: No bleeding problems or clotting disorders. Musculoskeletal: No joint pain or joint swelling. Gastrointestinal: No blood in stool or hematemesis Genitourinary: No dysuria or  hematuria. Psychiatric:: No history of major depression. Integumentary: No rashes or ulcers. Constitutional: No fever or chills.  PHYSICAL EXAMINATION: General: The patient appears their stated age.  Vital signs are BP 90/65  Pulse 60  Resp 16  Ht 5' 4.5" (1.638 m)  Wt 138 lb 12.8 oz (62.959 kg)  BMI 23.46 kg/m2  SpO2 100% HEENT:  No gross abnormalities Pulmonary: Respirations are non-labored Abdomen: Soft and non-tender. Musculoskeletal: There are no major deformities.   Neurologic: No focal weakness or paresthesias are detected, Skin: There are no ulcer or rashes noted. Psychiatric: The patient has normal affect. Cardiovascular: There is a regular rate and rhythm without significant murmur appreciated. Palpable femoral pulses bilaterally. I can palpate a left posterior tibial. Faint carotid bruits bilaterally  Diagnostic Studies: Carotid ultrasound: 1-39% right carotid stenosis, 40-59% left carotid stenosis. Significant blood pressure gradient. The left was 160, the right was 91. The vertebral artery on the left could not be visualized.  Abdominal ultrasound: Suspect greater than 60% stenosis of the left renal artery. Resistive indices could not be calculated due to 2 poor visualization of the profunda renal parenchyma. The right kidney measures 7.9 cm. The left kidney measures 8.98 cm. The patient has a 3.7 cm abdominal aortic aneurysm. There are elevated velocities within the right common iliac artery. Peak systolic velocity was 389.  Ankle-brachial indices: 1.0 on the left with triphasic waveforms. 0.84 on the right with biphasic waveforms  Outside Studies/Documentation Historical records were reviewed.  They showed multiple bruits  Assessment:  #1: Abdominal aortic aneurysm #2: Peripheral vascular disease #3: Renal artery stenosis #4: carotid artery stenosis  Plan: #1: Abdominal aortic aneurysm - continue to follow this on a yearly basis. I discussed with the patient at  this size there is a small risk of rupture, but unlikely. #2: Peripheral vascular disease - the patient does have right common iliac stenosis, however this appears to be asymptomatic. I discussed the signs and symptoms of claudication. Should the patient develop these we could address this at a later date. #3: Renal artery stenosis  - the ultrasound today was limited do to the difficulty in evaluating her kidneys. However, there is a suspected left renal artery stenosis. It is not clear at this time as to whether this is in the midportion of the renal artery, out in the hilum or at the ostium. Because of the patient's worsening renal function and hypertension I feel it is reasonable to proceed with angiographic evaluation and possible intervention should a significant stenosis be identified. Because of her elevated creatinine I will to the study with CO2. I potentially did have to use a small amount of dye, 10-15 mL. This is been scheduled for Tuesday, October 15.  I will start bicarbonate upon her arrival the morning of her procedure. I also told patient that she may require an overnight admission following her procedure depending on amount of contrast utilized. #4: Carotid artery stenosis - The patient is asymptomatic. I'll follow her carotid artery stenosis with ultrasound in one year.     Jorge Ny, M.D. [facility] Vein Specialists of [place] Office: [phone] Pager:  [phone]

## 2012-09-18 NOTE — Interval H&P Note (Signed)
History and Physical Interval Note:  09/18/2012 11:12 AM  Rebecca Tate  has presented today for surgery, with the diagnosis of Renal stenosis  The various methods of treatment have been discussed with the patient and family. After consideration of risks, benefits and other options for treatment, the patient has consented to  Procedure(s) (LRB) with comments: RENAL ANGIOGRAM (N/A) as a surgical intervention .  The patient's history has been reviewed, patient examined, no change in status, stable for surgery.  I have reviewed the patient's chart and labs.  Questions were answered to the patient's satisfaction.     Talene Glastetter IV, V. WELLS

## 2012-09-19 ENCOUNTER — Encounter (HOSPITAL_COMMUNITY): Payer: Self-pay | Admitting: *Deleted

## 2012-09-19 ENCOUNTER — Encounter (HOSPITAL_COMMUNITY)
Admission: RE | Disposition: A | Discharge status: Home / Self Care | Payer: Self-pay | Source: Ambulatory Visit | Attending: Surgery

## 2012-09-19 LAB — BASIC METABOLIC PANEL
BUN: 38 mg/dL — ABNORMAL HIGH (ref 6–23)
CO2: 24 mEq/L (ref 19–32)
Chloride: 107 mEq/L (ref 96–112)
Creatinine, Ser: 2.21 mg/dL — ABNORMAL HIGH (ref 0.50–1.10)
Glucose, Bld: 90 mg/dL (ref 70–99)

## 2012-09-19 SURGERY — RENAL ANGIOGRAM
Anesthesia: LOCAL

## 2012-09-19 NOTE — Progress Notes (Signed)
Utilization Review Completed.Rebecca Tate T10/16/2013   

## 2012-09-19 NOTE — Progress Notes (Signed)
Pt d/c home with instructions and f/u appointment with [facility]. Pt verbalized understanding of instructions, home with sister, escorted self out per request.

## 2012-10-09 ENCOUNTER — Encounter (HOSPITAL_COMMUNITY): Payer: Self-pay | Admitting: Pharmacy Technician

## 2012-10-11 ENCOUNTER — Other Ambulatory Visit: Payer: Self-pay

## 2012-10-15 ENCOUNTER — Other Ambulatory Visit: Payer: Self-pay

## 2012-10-16 ENCOUNTER — Encounter (HOSPITAL_COMMUNITY)
Admission: RE | Disposition: A | Discharge status: Home / Self Care | Payer: Self-pay | Source: Ambulatory Visit | Attending: Surgery

## 2012-10-16 ENCOUNTER — Encounter (HOSPITAL_COMMUNITY): Payer: Self-pay | Admitting: General Practice

## 2012-10-16 ENCOUNTER — Observation Stay (HOSPITAL_COMMUNITY)
Admission: RE | Admit: 2012-10-16 | Discharge: 2012-10-17 | DRG: 700 | Disposition: A | Discharge status: Home / Self Care | Payer: Medicare Other | Source: Ambulatory Visit | Attending: Surgery | Admitting: Surgery

## 2012-10-16 DIAGNOSIS — N189 Chronic kidney disease, unspecified: Secondary | ICD-10-CM | POA: Insufficient documentation

## 2012-10-16 DIAGNOSIS — Z87891 Personal history of nicotine dependence: Secondary | ICD-10-CM | POA: Insufficient documentation

## 2012-10-16 DIAGNOSIS — Z90710 Acquired absence of both cervix and uterus: Secondary | ICD-10-CM | POA: Insufficient documentation

## 2012-10-16 DIAGNOSIS — R0989 Other specified symptoms and signs involving the circulatory and respiratory systems: Secondary | ICD-10-CM | POA: Insufficient documentation

## 2012-10-16 DIAGNOSIS — Z888 Allergy status to other drugs, medicaments and biological substances status: Secondary | ICD-10-CM | POA: Insufficient documentation

## 2012-10-16 DIAGNOSIS — I701 Atherosclerosis of renal artery: Principal | ICD-10-CM | POA: Insufficient documentation

## 2012-10-16 DIAGNOSIS — Z860100 Personal history of colon polyps, unspecified: Secondary | ICD-10-CM | POA: Insufficient documentation

## 2012-10-16 DIAGNOSIS — Z79899 Other long term (current) drug therapy: Secondary | ICD-10-CM | POA: Insufficient documentation

## 2012-10-16 DIAGNOSIS — L930 Discoid lupus erythematosus: Secondary | ICD-10-CM | POA: Insufficient documentation

## 2012-10-16 DIAGNOSIS — Z8249 Family history of ischemic heart disease and other diseases of the circulatory system: Secondary | ICD-10-CM | POA: Insufficient documentation

## 2012-10-16 DIAGNOSIS — I129 Hypertensive chronic kidney disease with stage 1 through stage 4 chronic kidney disease, or unspecified chronic kidney disease: Secondary | ICD-10-CM | POA: Insufficient documentation

## 2012-10-16 DIAGNOSIS — H269 Unspecified cataract: Secondary | ICD-10-CM | POA: Insufficient documentation

## 2012-10-16 DIAGNOSIS — Z880 Allergy status to penicillin: Secondary | ICD-10-CM | POA: Insufficient documentation

## 2012-10-16 DIAGNOSIS — I714 Abdominal aortic aneurysm, without rupture, unspecified: Secondary | ICD-10-CM | POA: Insufficient documentation

## 2012-10-16 DIAGNOSIS — H409 Unspecified glaucoma: Secondary | ICD-10-CM | POA: Insufficient documentation

## 2012-10-16 DIAGNOSIS — T465X5A Adverse effect of other antihypertensive drugs, initial encounter: Secondary | ICD-10-CM | POA: Insufficient documentation

## 2012-10-16 DIAGNOSIS — I771 Stricture of artery: Secondary | ICD-10-CM | POA: Insufficient documentation

## 2012-10-16 DIAGNOSIS — I739 Peripheral vascular disease, unspecified: Secondary | ICD-10-CM | POA: Insufficient documentation

## 2012-10-16 DIAGNOSIS — I6529 Occlusion and stenosis of unspecified carotid artery: Secondary | ICD-10-CM | POA: Insufficient documentation

## 2012-10-16 DIAGNOSIS — E7800 Pure hypercholesterolemia, unspecified: Secondary | ICD-10-CM | POA: Insufficient documentation

## 2012-10-16 DIAGNOSIS — E039 Hypothyroidism, unspecified: Secondary | ICD-10-CM | POA: Insufficient documentation

## 2012-10-16 DIAGNOSIS — Y92009 Unspecified place in unspecified non-institutional (private) residence as the place of occurrence of the external cause: Secondary | ICD-10-CM | POA: Insufficient documentation

## 2012-10-16 DIAGNOSIS — Z7982 Long term (current) use of aspirin: Secondary | ICD-10-CM | POA: Insufficient documentation

## 2012-10-16 HISTORY — DX: Shortness of breath: R06.02

## 2012-10-16 LAB — URINE MICROSCOPIC-ADD ON

## 2012-10-16 LAB — POCT I-STAT, CHEM 8
Chloride: 113 mEq/L — ABNORMAL HIGH (ref 96–112)
Glucose, Bld: 155 mg/dL — ABNORMAL HIGH (ref 70–99)
HCT: 26 % — ABNORMAL LOW (ref 36.0–46.0)
Hemoglobin: 8.8 g/dL — ABNORMAL LOW (ref 12.0–15.0)
Potassium: 5 mEq/L (ref 3.5–5.1)

## 2012-10-16 LAB — URINALYSIS, ROUTINE W REFLEX MICROSCOPIC
Bilirubin Urine: NEGATIVE
Ketones, ur: NEGATIVE mg/dL
Leukocytes, UA: NEGATIVE
Nitrite: NEGATIVE
Specific Gravity, Urine: 1.014 (ref 1.005–1.030)
Urobilinogen, UA: 0.2 mg/dL (ref 0.0–1.0)
pH: 6.5 (ref 5.0–8.0)

## 2012-10-16 SURGERY — RENAL ANGIOGRAM
Anesthesia: LOCAL

## 2012-10-16 MED ORDER — LABETALOL HCL 5 MG/ML IV SOLN
10.0000 mg | INTRAVENOUS | Status: DC | PRN
  Filled 2012-10-16: qty 4

## 2012-10-16 MED ORDER — ACETAMINOPHEN 650 MG RE SUPP: 325.0000 mg | RECTAL | Status: DC | PRN

## 2012-10-16 MED ORDER — DORZOLAMIDE HCL 2 % OP SOLN
1.0000 [drp] | Freq: Two times a day (BID) | OPHTHALMIC | Status: DC
  Filled 2012-10-16: qty 10

## 2012-10-16 MED ORDER — ACETAMINOPHEN 325 MG PO TABS: 325.0000 mg | ORAL_TABLET | ORAL | Status: DC | PRN

## 2012-10-16 MED ORDER — EZETIMIBE 10 MG PO TABS
10.0000 mg | ORAL_TABLET | Freq: Every day | ORAL | Status: DC
  Filled 2012-10-16 (×2): qty 1

## 2012-10-16 MED ORDER — METOPROLOL SUCCINATE ER 25 MG PO TB24
25.0000 mg | ORAL_TABLET | Freq: Every day | ORAL | Status: DC
  Filled 2012-10-16 (×2): qty 1

## 2012-10-16 MED ORDER — ONDANSETRON HCL 4 MG/2ML IJ SOLN: 4.0000 mg | Freq: Four times a day (QID) | INTRAMUSCULAR | Status: DC | PRN

## 2012-10-16 MED ORDER — SODIUM CHLORIDE 0.9 % IV SOLN
INTRAVENOUS | Status: DC
  Administered 2012-10-16: 08:00:00 via INTRAVENOUS

## 2012-10-16 MED ORDER — LEVOTHYROXINE SODIUM 75 MCG PO TABS
75.0000 ug | ORAL_TABLET | Freq: Every day | ORAL | Status: DC
  Filled 2012-10-16 (×3): qty 1

## 2012-10-16 MED ORDER — SODIUM CHLORIDE 0.9 % IV SOLN: INTRAVENOUS | Status: DC

## 2012-10-16 MED ORDER — AMLODIPINE BESYLATE 10 MG PO TABS
10.0000 mg | ORAL_TABLET | Freq: Every day | ORAL | Status: DC
  Filled 2012-10-16 (×2): qty 1

## 2012-10-16 MED ORDER — SODIUM CHLORIDE 0.9 % IV SOLN
INTRAVENOUS | Status: DC
  Administered 2012-10-16 – 2012-10-17 (×3): via INTRAVENOUS

## 2012-10-16 MED ORDER — METOPROLOL TARTRATE 1 MG/ML IV SOLN: 2.0000 mg | INTRAVENOUS | Status: DC | PRN

## 2012-10-17 ENCOUNTER — Ambulatory Visit (HOSPITAL_COMMUNITY): Admission: RE | Admit: 2012-10-17 | Payer: Medicare Other | Source: Ambulatory Visit | Admitting: Surgery

## 2012-10-17 ENCOUNTER — Encounter (HOSPITAL_COMMUNITY)
Admission: RE | Disposition: A | Discharge status: Home / Self Care | Payer: Self-pay | Source: Ambulatory Visit | Attending: Surgery

## 2012-10-17 DIAGNOSIS — I701 Atherosclerosis of renal artery: Secondary | ICD-10-CM

## 2012-10-17 HISTORY — PX: RENAL ANGIOGRAM: SHX5509

## 2012-10-17 LAB — BASIC METABOLIC PANEL
Calcium: 8.7 mg/dL (ref 8.4–10.5)
GFR calc non Af Amer: 23 mL/min — ABNORMAL LOW (ref >90)
Glucose, Bld: 84 mg/dL (ref 70–99)
Sodium: 140 mEq/L (ref 135–145)

## 2012-10-17 SURGERY — RENAL ANGIOGRAM
Anesthesia: LOCAL

## 2012-10-17 MED ORDER — ACETAMINOPHEN 325 MG PO TABS: 325.0000 mg | ORAL_TABLET | ORAL | Status: DC | PRN

## 2012-10-17 MED ORDER — SODIUM CHLORIDE 0.9 % IV SOLN: INTRAVENOUS | Status: DC

## 2012-10-17 MED ORDER — GUAIFENESIN-DM 100-10 MG/5ML PO SYRP: 15.0000 mL | ORAL_SOLUTION | ORAL | Status: DC | PRN

## 2012-10-17 MED ORDER — CLONIDINE HCL 0.2 MG PO TABS
0.2000 mg | ORAL_TABLET | ORAL | Status: DC | PRN
  Administered 2012-10-17: 0.2 mg via ORAL
  Filled 2012-10-17: qty 1

## 2012-10-17 MED ORDER — HEPARIN SODIUM (PORCINE) 1000 UNIT/ML IJ SOLN
INTRAMUSCULAR | Status: AC
  Filled 2012-10-17: qty 1

## 2012-10-17 MED ORDER — ONDANSETRON HCL 4 MG/2ML IJ SOLN: 4.0000 mg | Freq: Four times a day (QID) | INTRAMUSCULAR | Status: DC | PRN

## 2012-10-17 MED ORDER — HEPARIN (PORCINE) IN NACL 2-0.9 UNIT/ML-% IJ SOLN
INTRAMUSCULAR | Status: AC
  Filled 2012-10-17: qty 1000

## 2012-10-17 MED ORDER — MIDAZOLAM HCL 2 MG/2ML IJ SOLN
INTRAMUSCULAR | Status: AC
  Filled 2012-10-17: qty 2

## 2012-10-17 MED ORDER — METOPROLOL TARTRATE 1 MG/ML IV SOLN: 2.0000 mg | INTRAVENOUS | Status: DC | PRN

## 2012-10-17 MED ORDER — PHENOL 1.4 % MT LIQD
1.0000 | OROMUCOSAL | Status: DC | PRN
  Filled 2012-10-17: qty 177

## 2012-10-17 MED ORDER — ALUM & MAG HYDROXIDE-SIMETH 200-200-20 MG/5ML PO SUSP: 15.0000 mL | ORAL | Status: DC | PRN

## 2012-10-17 MED ORDER — LIDOCAINE HCL (PF) 1 % IJ SOLN
INTRAMUSCULAR | Status: AC
  Filled 2012-10-17: qty 30

## 2012-10-17 MED ORDER — ACETAMINOPHEN 650 MG RE SUPP: 325.0000 mg | RECTAL | Status: DC | PRN

## 2012-10-17 MED ORDER — FENTANYL CITRATE 0.05 MG/ML IJ SOLN
INTRAMUSCULAR | Status: AC
  Filled 2012-10-17: qty 2

## 2012-10-17 NOTE — Progress Notes (Signed)
Progress note post angiogram.  Pt doing well, sleepy but arousable.  Right groing soft , no hematoma.  OOB/Ambulate after 4 hours bedrest from sheath pull  Will DC this PM after ambulates ,voids and takes PO  Reangio next tuesday

## 2012-10-17 NOTE — H&P (Signed)
Jorge Ny, MD 09/03/2012 1:54 PM Signed  Vascular and Vein Specialist of [place]  Patient name: Rebecca Tate MRN: [id number] DOB: 17-Nov-1936 Sex: female  Referred by: Dr. Arrie Aran  Reason for referral:  Chief Complaint   Patient presents with   .  New Evaluation     Renal Artery , Carotid Bruit and Left Femoral Bruit. Ref. by Dr. Hansel Feinstein    HISTORY OF PRESENT ILLNESS:  The patient comes in today for evaluation of multiple bruits, and a vascular evaluation. She has a non-small abdominal aortic aneurysm. She has not left subclavian artery stenosis which is asymptomatic. She does have a discrepancy between the blood pressures in her right and left arm. During a recent examination she is found to have carotid abdominal bruits. She denies any neurovascular symptoms. Specifically, she denies numbness or weakness in either extremity. She denies slurred speech. She denies amaurosis fugax. She does have a history of hypertension and renal insufficiency. She has a history of tobacco abuse but quit 3-1/2 years ago.  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  Past Medical History   Diagnosis  Date   .  Hypertension    .  Hypothyroidism      thyroid nodule   .  Hypercholesteremia    .  Cataracts, bilateral    .  Hx of adenomatous colonic polyps    .  AAA (abdominal aortic aneurysm)    .  Gallstones    .  Glaucoma    .  ANA positive    .  Carotid artery occlusion    .  Chronic kidney disease     Past Surgical History   Procedure  Date   .  Appendectomy    .  Colonoscopy    .  Eus  06/20/2012     Procedure: ESOPHAGEAL ENDOSCOPIC ULTRASOUND (EUS) RADIAL; Surgeon: Willis Modena, MD; Location: [facility]; Service: Endoscopy; Laterality: N/A;   .  Abdominal hysterectomy  1975     partial    History    Social History   .  Marital Status:  Single     Spouse Name:  N/A     Number of Children:  N/A   .  Years of Education:  N/A     Occupational History   .  Not on file.    Social History Main Topics   .  Smoking status:  Former Smoker -- 0.5 packs/day     Types:  Cigarettes     Quit date:  12/05/2008   .  Smokeless tobacco:  Not on file   .  Alcohol Use:  No   .  Drug Use:  No   .  Sexually Active:     Other Topics  Concern   .  Not on file    Social History Narrative   .  No narrative on file    Family History   Problem  Relation  Age of Onset   .  Heart disease  Mother    .  Hypertension  Mother    .  Heart attack  Father    .  Diabetes  Sister    .  Hypertension  Sister    .  Diabetes  Brother       Amputation    .  Heart disease  Brother    .  Hypertension  Brother     Allergies as of 09/03/2012 - Review Complete 09/03/2012  Allergen  Reaction  Noted   .  Hydralazine  Nausea And Vomiting  09/03/2012   .  Penicillins      Current Outpatient Prescriptions on File Prior to Visit   Medication  Sig  Dispense  Refill   .  amLODipine (NORVASC) 10 MG tablet  Take 10 mg by mouth daily.     Marland Kitchen  aspirin (ASPIRIN EC) 81 MG EC tablet  Take 81 mg by mouth daily. Swallow whole.     .  cholecalciferol (VITAMIN D) 1000 UNITS tablet  Take 1,000 Units by mouth daily.     Marland Kitchen  latanoprost (XALATAN) 0.005 % ophthalmic solution  1 drop at bedtime.     Marland Kitchen  levothyroxine (SYNTHROID, LEVOTHROID) 75 MCG tablet  Take 75 mcg by mouth daily.     .  metoprolol succinate (TOPROL-XL) 25 MG 24 hr tablet      .  hydrALAZINE (APRESOLINE) 25 MG tablet  Take 25 mg by mouth 2 (two) times daily.      REVIEW OF SYSTEMS:  Cardiovascular: No chest pain, chest pressure, palpitations,No claudication or rest pain, No history of DVT or phlebitis. She does get cramps in her left leg at night, and she has leg swelling  Pulmonary: No productive cough, asthma or wheezing. Positive for shortness of breath with exertion.  Neurologic: No weakness, paresthesias, aphasia, or amaurosis. No dizziness.  Hematologic: No bleeding problems or  clotting disorders.  Musculoskeletal: No joint pain or joint swelling.  Gastrointestinal: No blood in stool or hematemesis  Genitourinary: No dysuria or hematuria.  Psychiatric:: No history of major depression.  Integumentary: No rashes or ulcers.  Constitutional: No fever or chills.  PHYSICAL EXAMINATION:  General: The patient appears their stated age. Vital signs are BP 90/65  Pulse 60  Resp 16  Ht 5' 4.5" (1.638 m)  Wt 138 lb 12.8 oz (62.959 kg)  BMI 23.46 kg/m2  SpO2 100%  HEENT: No gross abnormalities  Pulmonary: Respirations are non-labored  Abdomen: Soft and non-tender.  Musculoskeletal: There are no major deformities.  Neurologic: No focal weakness or paresthesias are detected,  Skin: There are no ulcer or rashes noted.  Psychiatric: The patient has normal affect.  Cardiovascular: There is a regular rate and rhythm without significant murmur appreciated. Palpable femoral pulses bilaterally. I can palpate a left posterior tibial. Faint carotid bruits bilaterally  Diagnostic Studies:  Carotid ultrasound: 1-39% right carotid stenosis, 40-59% left carotid stenosis. Significant blood pressure gradient. The left was 160, the right was 91. The vertebral artery on the left could not be visualized.  Abdominal ultrasound: Suspect greater than 60% stenosis of the left renal artery. Resistive indices could not be calculated due to 2 poor visualization of the profunda renal parenchyma. The right kidney measures 7.9 cm. The left kidney measures 8.98 cm. The patient has a 3.7 cm abdominal aortic aneurysm. There are elevated velocities within the right common iliac artery. Peak systolic velocity was 389.  Ankle-brachial indices: 1.0 on the left with triphasic waveforms. 0.84 on the right with biphasic waveforms  Outside Studies/Documentation  Historical records were reviewed. They showed multiple bruits  Assessment:  #1: Abdominal aortic aneurysm  #2: Peripheral vascular disease  #3: Renal  artery stenosis  #4: carotid artery stenosis  Plan:  #1: Abdominal aortic aneurysm - continue to follow this on a yearly basis. I discussed with the patient at this size there is a small risk of rupture, but unlikely.  #2: Peripheral vascular disease - the patient  does have right common iliac stenosis, however this appears to be asymptomatic. I discussed the signs and symptoms of claudication. Should the patient develop these we could address this at a later date.  #3: Renal artery stenosis - the ultrasound today was limited do to the difficulty in evaluating her kidneys. However, there is a suspected left renal artery stenosis. It is not clear at this time as to whether this is in the midportion of the renal artery, out in the hilum or at the ostium. Because of the patient's worsening renal function and hypertension I feel it is reasonable to proceed with angiographic evaluation and possible intervention should a significant stenosis be identified. Because of her elevated creatinine I will to the study with CO2. I potentially did have to use a small amount of dye, 10-15 mL. This is been scheduled for Tuesday, October 15. I will start bicarbonate upon her arrival the morning of her procedure. I also told patient that she may require an overnight admission following her procedure depending on amount of contrast utilized.  #4: Carotid artery stenosis - The patient is asymptomatic. I'll follow her carotid artery stenosis with ultrasound in one year.   Patient is back for repeat attempt at renal stenting.  She was hydrated over night due to a cr of 2.9.  It is 2.0 today   V. Charlena Cross, M.D.  [facility] Vein Specialists of [place]  Office: [phone]  Pager: [phone]

## 2012-10-17 NOTE — Op Note (Signed)
Vascular and Vein Specialists of [facility]  Patient name: Rebecca Tate MRN: [id number] DOB: 1936/10/23 Sex: female  10/16/2012 - 10/17/2012 Pre-operative Diagnosis: Left renal artery stenosis Post-operative diagnosis:  Same Surgeon:  Jorge Ny Procedure Performed:  1.  ultrasound access right femoral artery  2.  left renal angiogram  3.  first order catheterization (left renal artery)    Indications:  The patient has had a decline in her renal function. She has known left renal artery stenosis  Procedure:  The patient was identified in the holding area and taken to room 8.  The patient was then placed supine on the table and prepped and draped in the usual sterile fashion.  A time out was called.  Ultrasound was used to evaluate the right common femoral artery.  It was patent .  A digital ultrasound image was acquired.  A micropuncture needle was used to access the right common femoral artery under ultrasound guidance.  An 018 wire was advanced without resistance and a micropuncture sheath was placed.  The 018 wire was removed and a benson wire was placed.  The micropuncture sheath was exchanged for a 6 french sheath.  I then selected the left renal artery using a Cobra 1 catheter. I tried to advance and 025 wire out into the distal branches. I was unable to get this wire to advance without taking the catheter out of the artery. I then used a 014 stabilizer wire. I was able to advance this artery out into the distal branches of the left renal artery. I then tried to exchange catheters for a quick cross catheter, and an attempt to up size to a 035 wire. Again, there was not enough support from the wire to allow a catheter exchange, as the main renal artery is very short. I spent approximately an hour and half trying to get a stable platform however ultimately I was unsuccessful. Renal angiography confirmed a stenosis at the origin of the left renal artery  A total of 5 cc of contrast  were utilized  Impression:  #1  unsuccessful attempt at left renal artery stenting. I believe I have exhausted all possibilities and techniques to stent this artery from the groin. The next attempt will be from the right brachial artery, as she has significant left subclavian disease.   Juleen China, M.D. [facility] of [place] Office: [phone] Pager:  [phone]

## 2012-10-17 NOTE — Discharge Summary (Signed)
Vascular and Vein Specialists Discharge Summary   Patient ID:  Rebecca Tate MRN: [id number] DOB/AGE: October 21, 1936 76 y.o.  Admit date: 10/16/2012 Discharge date: 10/17/2012 Date of Surgery: 10/16/2012 - 10/17/2012 Surgeon: Rebecca Tate): Rebecca Libman, MD  Admission Diagnosis: ras renal artery stent  Discharge Diagnoses:  ras renal artery stent  Secondary Diagnoses: Past Medical History  Diagnosis Date  . Hypertension   . Hypothyroidism     thyroid nodule  . Hypercholesteremia   . Cataracts, bilateral   . Hx of adenomatous colonic polyps   . AAA (abdominal aortic aneurysm)   . Gallstones   . Glaucoma   . ANA positive   . Carotid artery occlusion   . Chronic kidney disease   . Shortness of breath     "  at times"    Procedure(s): RENAL ANGIOGRAM  Discharged Condition: good  HPI: Rebecca Tate is a 76 y.o. female for evaluation of multiple bruits, and a vascular evaluation. She has a small abdominal aortic aneurysm. She has left subclavian artery stenosis which is asymptomatic. She does have a discrepancy between the blood pressures in her right and left arm. During a recent examination she is found to have carotid abdominal bruits. She denies any neurovascular symptoms. Specifically, she denies numbness or weakness in either extremity. She denies slurred speech. She denies amaurosis fugax. She does have a history of hypertension and renal insufficiency. She has a history of tobacco abuse but quit 3-1/2 years ago.  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 She was admitted for hydration prior to renal artery angiogram and possible stenting     Hospital Course:  Rebecca Tate is a 76 y.o. female is S/P Left 1. ultrasound access right femoral artery  2. left renal angiogram  3. first order catheterization (left renal artery)  RENAL ANGIOGRAM Post-op wounds healing without hematoma Pt. Ambulating, voiding and taking PO diet  without difficulty. Labs as below Impression:  #1 unsuccessful attempt at left renal artery stenting. I believe I have exhausted all possibilities and techniques to stent this artery from the groin. The next attempt will be from the right brachial artery, as she has significant left subclavian disease.  Pt ambulated and voided Her right groin wound was soft  Consults:     Significant Diagnostic Studies: CBC Lab Results  Component Value Date   HGB 8.8* 10/16/2012   HCT 26.0* 10/16/2012    BMET    Component Value Date/Time   NA 140 10/17/2012 0450   K 4.3 10/17/2012 0450   CL 110 10/17/2012 0450   CO2 18* 10/17/2012 0450   GLUCOSE 84 10/17/2012 0450   BUN 41* 10/17/2012 0450   CREATININE 2.05* 10/17/2012 0450   CALCIUM 8.7 10/17/2012 0450   GFRNONAA 23* 10/17/2012 0450   GFRAA 26* 10/17/2012 0450   COAG No results found for this basename: INR, PROTIME     Disposition:  Discharge to :Home Discharge Orders    Future Orders Please Complete By Expires   Resume previous diet      Driving Restrictions      Comments:   No driving for at least 48 hours and you are off pain medication   Lifting restrictions      Comments:   No lifting for 2 weeks   Call MD for:  temperature >100.5      Call MD for:  redness, tenderness, or signs of infection (pain, swelling, bleeding, redness, odor or green/yellow discharge around incision  site)      Call MD for:  severe or increased pain, loss or decreased feeling  in affected limb(s)      Increase activity slowly      Comments:   Walk with assistance use walker or cane as needed   May shower       Scheduling Instructions:   Thursday   Remove dressing in 24 hours         [name], [name]  Home Medication Instructions [id number]   Printed on:10/17/12 1320  Medication Information                    levothyroxine (SYNTHROID, LEVOTHROID) 75 MCG tablet Take 75 mcg by mouth daily.             amLODipine (NORVASC) 10 MG  tablet Take 10 mg by mouth daily.             aspirin (ASPIRIN EC) 81 MG EC tablet Take 81 mg by mouth daily. Swallow whole.            cholecalciferol (VITAMIN D) 1000 UNITS tablet Take 1,000 Units by mouth daily.           metoprolol succinate (TOPROL-XL) 25 MG 24 hr tablet Take 25 mg by mouth daily.            dorzolamide (TRUSOPT) 2 % ophthalmic solution Place 1 drop into both eyes 2 (two) times daily.           ezetimibe (ZETIA) 10 MG tablet Take 10 mg by mouth daily.            Verbal and written Discharge instructions given to the patient. Wound care per Discharge AVS  Pt to F/U next Tuesday for repeat angiogram and poss renal artery stenting through Brachial artery approach  Signed: Marlowe Shores 10/17/2012, 1:20 PM

## 2012-10-19 ENCOUNTER — Other Ambulatory Visit: Payer: Self-pay | Admitting: *Deleted

## 2012-10-22 NOTE — Discharge Summary (Signed)
I agree with the above. The patient had a failed attempt at renal artery stenting. She was admitted overnight for hydration because of acute renal insufficiency. This resolved with hydration. She is going to be brought back in one week for a repeat attempt at renal artery stenting with a right brachial approach.  Durene Cal

## 2012-10-23 ENCOUNTER — Encounter (HOSPITAL_COMMUNITY)
Admission: RE | Disposition: A | Discharge status: Home / Self Care | Payer: Self-pay | Source: Ambulatory Visit | Attending: Surgery

## 2012-10-23 ENCOUNTER — Ambulatory Visit (HOSPITAL_COMMUNITY)
Admission: RE | Admit: 2012-10-23 | Discharge: 2012-10-23 | Disposition: A | Discharge status: Home / Self Care | Payer: Medicare Other | Source: Ambulatory Visit | Attending: Surgery | Admitting: Surgery

## 2012-10-23 ENCOUNTER — Telehealth: Payer: Self-pay | Admitting: Surgery

## 2012-10-23 DIAGNOSIS — I714 Abdominal aortic aneurysm, without rupture, unspecified: Secondary | ICD-10-CM | POA: Insufficient documentation

## 2012-10-23 DIAGNOSIS — E039 Hypothyroidism, unspecified: Secondary | ICD-10-CM | POA: Insufficient documentation

## 2012-10-23 DIAGNOSIS — I701 Atherosclerosis of renal artery: Secondary | ICD-10-CM | POA: Insufficient documentation

## 2012-10-23 DIAGNOSIS — I10 Essential (primary) hypertension: Secondary | ICD-10-CM | POA: Insufficient documentation

## 2012-10-23 DIAGNOSIS — E7800 Pure hypercholesterolemia, unspecified: Secondary | ICD-10-CM | POA: Insufficient documentation

## 2012-10-23 HISTORY — PX: RENAL ANGIOGRAM: SHX5509

## 2012-10-23 HISTORY — PX: PERCUTANEOUS STENT INTERVENTION: SHX5500

## 2012-10-23 LAB — POCT ACTIVATED CLOTTING TIME
Activated Clotting Time: 214 seconds
Activated Clotting Time: 184 seconds
Activated Clotting Time: 179 seconds

## 2012-10-23 LAB — POCT I-STAT, CHEM 8
Creatinine, Ser: 2.6 mg/dL — ABNORMAL HIGH (ref 0.50–1.10)
Glucose, Bld: 95 mg/dL (ref 70–99)
HCT: 37 % (ref 36.0–46.0)
Hemoglobin: 12.6 g/dL (ref 12.0–15.0)
Potassium: 4.3 mEq/L (ref 3.5–5.1)
Sodium: 144 mEq/L (ref 135–145)
TCO2: 21 mmol/L (ref 0–100)

## 2012-10-23 SURGERY — RENAL ANGIOGRAM

## 2012-10-23 MED ORDER — ACETAMINOPHEN 325 MG PO TABS: 325.0000 mg | ORAL_TABLET | ORAL | Status: DC | PRN

## 2012-10-23 MED ORDER — SODIUM CHLORIDE 0.9 % IV SOLN
INTRAVENOUS | Status: DC
  Administered 2012-10-23: 1000 mL via INTRAVENOUS

## 2012-10-23 MED ORDER — CIPROFLOXACIN HCL 500 MG PO TABS: 500.0000 mg | ORAL_TABLET | Freq: Once | ORAL | Status: DC

## 2012-10-23 MED ORDER — CLONIDINE HCL 0.2 MG PO TABS
0.2000 mg | ORAL_TABLET | ORAL | Status: DC | PRN
  Administered 2012-10-23: 0.2 mg via ORAL
  Filled 2012-10-23: qty 1

## 2012-10-23 MED ORDER — PHENOL 1.4 % MT LIQD
1.0000 | OROMUCOSAL | Status: DC | PRN
  Filled 2012-10-23: qty 177

## 2012-10-23 MED ORDER — ACETAMINOPHEN 325 MG RE SUPP: 325.0000 mg | RECTAL | Status: DC | PRN

## 2012-10-23 MED ORDER — ALUM & MAG HYDROXIDE-SIMETH 200-200-20 MG/5ML PO SUSP: 15.0000 mL | ORAL | Status: DC | PRN

## 2012-10-23 MED ORDER — HEPARIN SODIUM (PORCINE) 1000 UNIT/ML IJ SOLN
INTRAMUSCULAR | Status: AC
  Filled 2012-10-23: qty 1

## 2012-10-23 MED ORDER — SODIUM CHLORIDE 0.9 % IV SOLN: 1.0000 mL/kg/h | INTRAVENOUS | Status: DC

## 2012-10-23 MED ORDER — SODIUM CHLORIDE 0.9 % IV BOLUS (SEPSIS): 500.0000 mL | Freq: Once | INTRAVENOUS | Status: DC

## 2012-10-23 MED ORDER — GUAIFENESIN-DM 100-10 MG/5ML PO SYRP: 15.0000 mL | ORAL_SOLUTION | ORAL | Status: DC | PRN

## 2012-10-23 MED ORDER — MIDAZOLAM HCL 2 MG/2ML IJ SOLN
INTRAMUSCULAR | Status: AC
  Filled 2012-10-23: qty 2

## 2012-10-23 MED ORDER — FENTANYL CITRATE 0.05 MG/ML IJ SOLN
INTRAMUSCULAR | Status: AC
  Filled 2012-10-23: qty 2

## 2012-10-23 MED ORDER — LIDOCAINE HCL (PF) 1 % IJ SOLN
INTRAMUSCULAR | Status: AC
  Filled 2012-10-23: qty 30

## 2012-10-23 MED ORDER — METOPROLOL TARTRATE 1 MG/ML IV SOLN
2.0000 mg | INTRAVENOUS | Status: DC | PRN
  Filled 2012-10-23: qty 5

## 2012-10-23 MED ORDER — ONDANSETRON HCL 4 MG/2ML IJ SOLN: 4.0000 mg | Freq: Four times a day (QID) | INTRAMUSCULAR | Status: DC | PRN

## 2012-10-23 MED ORDER — SODIUM CHLORIDE 0.9 % IV BOLUS (SEPSIS)
500.0000 mL | Freq: Once | INTRAVENOUS | Status: AC
  Administered 2012-10-23: 500 mL via INTRAVENOUS

## 2012-10-23 NOTE — Op Note (Signed)
[facility]  Patient name: Rebecca Tate MRN: [id number] DOB: 11/26/1936 Sex: female  10/23/2012 Pre-operative Diagnosis: Left renal artery stenosis Post-operative diagnosis:  Same Surgeon:  Jorge Ny Procedure Performed:  1.  ultrasound access, right brachial artery  2.  left renal artery stent     Indications:  The patient has had multiple unsuccessful attempts at left renal artery stenting from a femoral approach. She comes in today for a brachial artery attempts.  Procedure:  The patient was identified in the holding area and taken to room 8.  The patient was then placed supine on the table and prepped and draped in the usual sterile fashion.  A time out was called.  Ultrasound was used to evaluate the right brachial artery. It was widely patent with minimal calcification. A digital ultrasound image was acquired. The right brachial artery was then accessed under ultrasound guidance using a micropuncture needle. A 014 wire was advanced without resistance and a micropuncture sheath was placed. Next a Rosen wire was placed followed by a 6 [place] sheath. Using a multipurpose catheter and a Benson wire, the catheter was advanced into the descending thoracic aorta. I placed 3000 units of heparin and 200 I grams of nitroglycerin through the brachial sheath. I then gave the patient additional 4000 units of heparin. I initially tried to cannulate the renal artery with a multipurpose catheter, however this was unsuccessful, and therefore I switched to a LCB catheter. Using a stabilizer wire, I was able to select the left renal artery. I then selected a 5 x 12 Herculink balloon expandable stent. This was advanced through the delivery catheter into the left renal artery. Contrast injections were performed to make sure the stent was in the appropriate location, and then it was deployed by taking the balloon to grade pressure which was 14 atmospheres. A completion  angiogram was performed which showed complete resolution of the left renal artery stenosis. A total of 9 mL of contrast were utilized. The catheters and wires were removed. The patient was taken the holding area for sheath pull, once her coagulation profile corrects  Impression:  #1  successful left renal artery stenting using a 5 x 12 Herculink stent  #2  a total of 9 cc of contrast were utilized    V. Durene Cal, M.D. [facility] [place] Office: [phone] Pager:  [phone]

## 2012-10-23 NOTE — H&P (Signed)
[facility]   Patient name: Rebecca Tate MRN: [id number] DOB: [birth year] Sex: female    No chief complaint on file.   HISTORY OF PRESENT ILLNESS: The patient comes in today for a repeat attempt at left renal artery stenting. She has previously undergone 2 failed attempts from the right femoral approach. This is done in the setting of hypertension and worsening renal function. She has left subclavian occlusive disease and therefore is not a candidate for a left brachial approach. Therefore, we decided to proceed with right brachial approach  Past Medical History  Diagnosis Date  . Hypertension   . Hypothyroidism     thyroid nodule  . Hypercholesteremia   . Cataracts, bilateral   . Hx of adenomatous colonic polyps   . AAA (abdominal aortic aneurysm)   . Gallstones   . Glaucoma   . ANA positive   . Carotid artery occlusion   . Chronic kidney disease   . Shortness of breath     "  at times"    Past Surgical History  Procedure Date  . Appendectomy   . Colonoscopy   . Eus 06/20/2012    Procedure: ESOPHAGEAL ENDOSCOPIC ULTRASOUND (EUS) RADIAL;  Surgeon: Willis Modena, MD;  Location: [facility];  Service: Endoscopy;  Laterality: N/A;  . Abdominal hysterectomy 1975    partial    History   Social History  . Marital Status: Single    Spouse Name: N/A    Number of Children: N/A  . Years of Education: N/A   Occupational History  . Not on file.   Social History Main Topics  . Smoking status: Former Smoker -- 0.5 packs/day    Types: Cigarettes    Quit date: 12/05/2008  . Smokeless tobacco: Never Used  . Alcohol Use: Yes     Comment: rare glass of wine  . Drug Use: No  . Sexually Active:    Other Topics Concern  . Not on file   Social History Narrative  . No narrative on file    Family History  Problem Relation Age of Onset  . Heart disease Mother   . Hypertension Mother   . Heart attack Father   . Diabetes Sister   .  Hypertension Sister   . Diabetes Brother     Amputation  . Heart disease Brother   . Hypertension Brother     Allergies as of 10/19/2012 - Review Complete 10/19/2012  Allergen Reaction Noted  . Hydralazine Other (See Comments) 09/03/2012  . Penicillins      No current facility-administered medications on file prior to encounter.   Current Outpatient Prescriptions on File Prior to Encounter  Medication Sig Dispense Refill  . amLODipine (NORVASC) 10 MG tablet Take 10 mg by mouth daily.        Marland Kitchen aspirin (ASPIRIN EC) 81 MG EC tablet Take 81 mg by mouth daily. Swallow whole.       . cholecalciferol (VITAMIN D) 1000 UNITS tablet Take 1,000 Units by mouth daily.      . dorzolamide (TRUSOPT) 2 % ophthalmic solution Place 1 drop into both eyes 2 (two) times daily.      Marland Kitchen ezetimibe (ZETIA) 10 MG tablet Take 10 mg by mouth daily.      Marland Kitchen levothyroxine (SYNTHROID, LEVOTHROID) 75 MCG tablet Take 75 mcg by mouth daily.        . metoprolol succinate (TOPROL-XL) 25 MG 24 hr tablet Take 25 mg by mouth daily.  REVIEW OF SYSTEMS: No changes from recent admission  PHYSICAL EXAMINATION:   Vital signs are BP 149/70  Pulse 66  Temp 96.8 F (36 C) (Oral)  Resp 18  Ht 5' 4.5" (1.638 m)  Wt 142 lb (64.411 kg)  BMI 24.00 kg/m2  SpO2 99% General: The patient appears their stated age. HEENT:  No gross abnormalities Pulmonary:  Non labored breathing Abdomen: Soft and non-tender Musculoskeletal: There are no major deformities. Neurologic: No focal weakness or paresthesias are detected, Skin: There are no ulcer or rashes noted. Psychiatric: The patient has normal affect. Cardiovascular: Palpable right brachial pulse     Assessment: Left renal artery stenosis Plan: Will repeat attempt at left renal artery stent, this time from the right brachial approach. The patient's creatinine is 2.6 today I will use minimal contrast. She is currently being hydrated. She was told as an outpatient to  hydrate herself as much as possible.  Jorge Ny, M.D. [facility] [place] Office: [phone] Pager:  [phone]

## 2012-10-23 NOTE — Progress Notes (Signed)
DR Myra Gianotti NOTIFIED OF CIPRO NOT GIVEN IN CATH LAB EARLIER TODAY; NO NEW ORDERS NOTED

## 2012-10-23 NOTE — Telephone Encounter (Addendum)
Message copied by Shari Prows on Tue Oct 23, 2012 10:04 AM ------      Message from: Melene Plan      Created: Tue Oct 23, 2012  9:25 AM                   ----- Message -----         From: Nada Libman, MD         Sent: 10/23/2012   8:55 AM           To: Reuel Derby, Melene Plan, RN            10/23/2012:             1.  ultrasound access, right brachial artery       2.  left renal artery stent                   Please schedule the patient to come back and see me in one month with a renal artery duplex  I scheduled an appt for the above patient on 11/26/12 at 8:30am for her vascular lab study and to see VWb at 9:30.awt

## 2012-10-23 NOTE — Progress Notes (Signed)
DR Myra Gianotti NOTIFIED OF B/P AND ORDER NOTED

## 2012-10-24 ENCOUNTER — Other Ambulatory Visit: Payer: Self-pay | Admitting: *Deleted

## 2012-10-24 DIAGNOSIS — I701 Atherosclerosis of renal artery: Secondary | ICD-10-CM

## 2012-11-23 ENCOUNTER — Encounter: Payer: Self-pay | Admitting: Surgery

## 2012-11-26 ENCOUNTER — Ambulatory Visit (INDEPENDENT_AMBULATORY_CARE_PROVIDER_SITE_OTHER): Payer: Medicare Other | Admitting: Surgery

## 2012-11-26 ENCOUNTER — Encounter: Payer: Self-pay | Admitting: Surgery

## 2012-11-26 ENCOUNTER — Other Ambulatory Visit (INDEPENDENT_AMBULATORY_CARE_PROVIDER_SITE_OTHER): Payer: Medicare Other | Admitting: *Deleted

## 2012-11-26 VITALS — BP 159/58 | HR 56 | Ht 64.5 in | Wt 142.0 lb

## 2012-11-26 DIAGNOSIS — I701 Atherosclerosis of renal artery: Secondary | ICD-10-CM

## 2012-11-26 NOTE — Progress Notes (Signed)
[facility]   Patient name: Rebecca Tate MRN: [id number] DOB: [birth year] Sex: female     Chief Complaint  Patient presents with  . Re-evaluation    s/p left renal artery stent 10/23/2012 - 1 month f/u - pt has no complaints    HISTORY OF PRESENT ILLNESS: The patient is back today for followup. She recently underwent left renal artery stenting. This was a very challenging case. I attempted access from the groin x2 and ultimately came from the right arm. This was ultimately successful. She has underlying hypertension and chronic renal insufficiency. She states that her blood pressure has been better at home when she checks it. She reports no other difficulties. She has had some bruising from the Plavix.    Past Medical History  Diagnosis Date  . Hypertension   . Hypothyroidism     thyroid nodule  . Hypercholesteremia   . Cataracts, bilateral   . Hx of adenomatous colonic polyps   . AAA (abdominal aortic aneurysm)   . Gallstones   . Glaucoma   . ANA positive   . Carotid artery occlusion   . Chronic kidney disease   . Shortness of breath     "  at times"    Past Surgical History  Procedure Date  . Appendectomy   . Colonoscopy   . Eus 06/20/2012    Procedure: ESOPHAGEAL ENDOSCOPIC ULTRASOUND (EUS) RADIAL;  Surgeon: Willis Modena, MD;  Location: [facility];  Service: Endoscopy;  Laterality: N/A;  . Abdominal hysterectomy 1975    partial    History   Social History  . Marital Status: Single    Spouse Name: N/A    Number of Children: N/A  . Years of Education: N/A   Occupational History  . Not on file.   Social History Main Topics  . Smoking status: Former Smoker -- 0.5 packs/day    Types: Cigarettes    Quit date: 12/05/2008  . Smokeless tobacco: Never Used  . Alcohol Use: Yes     Comment: rare glass of wine  . Drug Use: No  . Sexually Active: Not on file   Other Topics Concern  . Not on file   Social History Narrative  .  No narrative on file    Family History  Problem Relation Age of Onset  . Heart disease Mother   . Hypertension Mother   . Heart attack Father   . Diabetes Sister   . Hypertension Sister   . Diabetes Brother     Amputation  . Heart disease Brother   . Hypertension Brother     Allergies as of 11/26/2012 - Review Complete 11/26/2012  Allergen Reaction Noted  . Hydralazine Other (See Comments) 09/03/2012  . Penicillins      Current Outpatient Prescriptions on File Prior to Visit  Medication Sig Dispense Refill  . amLODipine (NORVASC) 10 MG tablet Take 10 mg by mouth daily.        Marland Kitchen aspirin (ASPIRIN EC) 81 MG EC tablet Take 81 mg by mouth daily. Swallow whole.       . cholecalciferol (VITAMIN D) 1000 UNITS tablet Take 1,000 Units by mouth daily.      . dorzolamide (TRUSOPT) 2 % ophthalmic solution Place 1 drop into both eyes 2 (two) times daily.      Marland Kitchen ezetimibe (ZETIA) 10 MG tablet Take 10 mg by mouth daily.      Marland Kitchen levothyroxine (SYNTHROID, LEVOTHROID) 75 MCG tablet Take 75 mcg  by mouth daily.        . metoprolol succinate (TOPROL-XL) 25 MG 24 hr tablet Take 25 mg by mouth daily.          REVIEW OF SYSTEMS: Please see history of present illness, otherwise no changes from prior visit  PHYSICAL EXAMINATION:   Vital signs are BP 159/58  Pulse 56  Ht 5' 4.5" (1.638 m)  Wt 142 lb (64.411 kg)  BMI 24.00 kg/m2  SpO2 100% General: The patient appears their stated age. HEENT:  No gross abnormalities Pulmonary:  Non labored breathing Musculoskeletal: There are no major deformities. Neurologic: No focal weakness or paresthesias are detected, Skin: Ecchymosis on left upper arm Psychiatric: The patient has normal affect. Cardiovascular: Palpable right radial pulse. The right brachial access site is completely healed   Diagnostic Studies Duplex ultrasound was performed today. This was a difficult study. There were slightly elevated velocities in the proximal left renal  artery.  Assessment: Status post left renal stenting Plan: This was an extremely difficult stent placement. There were excellent post procedure results. The patient has had an improvement in her blood pressure. I do not have her most recent creatinine. There are slightly elevated velocities within the stent, however I suspect this is related to the size of her renal artery, which was rather small. Given how difficult it was to place the stent I would certainly not active: A velocity in this range. I will have her come back in 6 months with a repeat ultrasound. We did discuss discontinuing her Plavix in 3 months if bruising continues to be a problem for her  V. Charlena Cross, M.D. [facility] [place] Office: [phone] Pager:  [phone]

## 2012-12-25 ENCOUNTER — Other Ambulatory Visit: Payer: Self-pay | Admitting: *Deleted

## 2012-12-25 DIAGNOSIS — I701 Atherosclerosis of renal artery: Secondary | ICD-10-CM

## 2012-12-25 DIAGNOSIS — Z48812 Encounter for surgical aftercare following surgery on the circulatory system: Secondary | ICD-10-CM

## 2013-02-21 ENCOUNTER — Other Ambulatory Visit: Payer: Self-pay | Admitting: Surgery

## 2013-02-22 ENCOUNTER — Other Ambulatory Visit: Payer: Self-pay | Admitting: Surgery

## 2013-02-22 ENCOUNTER — Other Ambulatory Visit: Payer: Self-pay | Admitting: *Deleted

## 2013-02-22 DIAGNOSIS — I739 Peripheral vascular disease, unspecified: Secondary | ICD-10-CM

## 2013-02-22 MED ORDER — CLOPIDOGREL BISULFATE 75 MG PO TABS: 75.0000 mg | ORAL_TABLET | Freq: Every day | ORAL | Status: DC

## 2013-05-27 ENCOUNTER — Other Ambulatory Visit: Payer: Medicare Other

## 2013-05-27 ENCOUNTER — Ambulatory Visit: Payer: Medicare Other | Admitting: Neurosurgery

## 2013-06-14 ENCOUNTER — Encounter: Payer: Self-pay | Admitting: Surgery

## 2013-06-17 ENCOUNTER — Encounter: Payer: Self-pay | Admitting: Surgery

## 2013-06-17 ENCOUNTER — Other Ambulatory Visit (INDEPENDENT_AMBULATORY_CARE_PROVIDER_SITE_OTHER): Payer: Medicare Other | Admitting: *Deleted

## 2013-06-17 ENCOUNTER — Ambulatory Visit (INDEPENDENT_AMBULATORY_CARE_PROVIDER_SITE_OTHER): Payer: Medicare Other | Admitting: Surgery

## 2013-06-17 VITALS — BP 144/73 | HR 62 | Resp 16 | Ht 64.0 in | Wt 137.0 lb

## 2013-06-17 DIAGNOSIS — I701 Atherosclerosis of renal artery: Secondary | ICD-10-CM

## 2013-06-17 DIAGNOSIS — Z48812 Encounter for surgical aftercare following surgery on the circulatory system: Secondary | ICD-10-CM

## 2013-06-17 NOTE — Addendum Note (Signed)
Addended by: Adria Dill L on: 06/17/2013 12:47 PM   Modules accepted: Orders

## 2013-06-17 NOTE — Progress Notes (Signed)
[facility] of [facility]   Patient name: Rebecca Tate MRN: [id number] DOB: [birth year] Sex: female     Chief Complaint  Patient presents with  . Renal Artery stenosis    6 mo f/up with vascular lab study.    HISTORY OF PRESENT ILLNESS: The patient is back today for followup of her left renal artery stent. This was put in in the setting of difficult to control blood pressure and renal insufficiency. It was very difficult to place. I attempted to come from the right groin on 2 separate occasions, and ultimately had to place the stent from the left brachial approach. The patient reports that her blood pressures have been better since her stent has been placed. Her creatinine remains stable at 2.8. The bruising which she initially had from starting Plavix has improved. She continues to take her Plavix.  Past Medical History  Diagnosis Date  . Hypertension   . Hypothyroidism     thyroid nodule  . Hypercholesteremia   . Cataracts, bilateral   . Hx of adenomatous colonic polyps   . AAA (abdominal aortic aneurysm)   . Gallstones   . Glaucoma   . ANA positive   . Carotid artery occlusion   . Chronic kidney disease   . Shortness of breath     "  at times"    Past Surgical History  Procedure Laterality Date  . Appendectomy    . Colonoscopy    . Eus  06/20/2012    Procedure: ESOPHAGEAL ENDOSCOPIC ULTRASOUND (EUS) RADIAL;  Surgeon: Willis Modena, MD;  Location: [facility];  Service: Endoscopy;  Laterality: N/A;  . Abdominal hysterectomy  1975    partial    History   Social History  . Marital Status: Single    Spouse Name: N/A    Number of Children: N/A  . Years of Education: N/A   Occupational History  . Not on file.   Social History Main Topics  . Smoking status: Former Smoker -- 0.50 packs/day    Types: Cigarettes    Quit date: 12/05/2008  . Smokeless tobacco: Never Used  . Alcohol Use: Yes     Comment: rare glass of wine  . Drug Use: No  .  Sexually Active: Not on file   Other Topics Concern  . Not on file   Social History Narrative  . No narrative on file    Family History  Problem Relation Age of Onset  . Heart disease Mother   . Hypertension Mother   . Diabetes Mother   . Heart attack Father   . Diabetes Sister   . Hypertension Sister   . Diabetes Brother     Amputation  . Heart disease Brother     Heart Disease before age 82  . Hypertension Brother   . Heart attack Brother     Allergies as of 06/17/2013 - Review Complete 06/17/2013  Allergen Reaction Noted  . Hydralazine Other (See Comments) 09/03/2012  . Penicillins      Current Outpatient Prescriptions on File Prior to Visit  Medication Sig Dispense Refill  . amLODipine (NORVASC) 10 MG tablet Take 10 mg by mouth daily.        . clopidogrel (PLAVIX) 75 MG tablet Take 1 tablet (75 mg total) by mouth daily.  30 tablet  6  . dorzolamide (TRUSOPT) 2 % ophthalmic solution Place 1 drop into both eyes 2 (two) times daily.      Marland Kitchen levothyroxine (SYNTHROID, LEVOTHROID) 75 MCG  tablet Take 75 mcg by mouth daily.        . metoprolol succinate (TOPROL-XL) 25 MG 24 hr tablet Take 25 mg by mouth daily.       Marland Kitchen aspirin (ASPIRIN EC) 81 MG EC tablet Take 81 mg by mouth daily. Swallow whole.       . cholecalciferol (VITAMIN D) 1000 UNITS tablet Take 1,000 Units by mouth daily.      Marland Kitchen ezetimibe (ZETIA) 10 MG tablet Take 10 mg by mouth daily.       No current facility-administered medications on file prior to visit.     REVIEW OF SYSTEMS: The patient is doing well today without complaints. Her only positive review of system is shortness of breath with exertion. All the systems are negative  PHYSICAL EXAMINATION:   Vital signs are BP 144/73  Pulse 62  Resp 16  Ht 5\' 4"  (1.626 m)  Wt 137 lb (62.143 kg)  BMI 23.5 kg/m2  SpO2 98% General: The patient appears their stated age. HEENT:  No gross abnormalities Pulmonary:  Non labored breathing Abdomen: Soft and  non-tender. No abdominal bruits Musculoskeletal: There are no major deformities. Neurologic: No focal weakness or paresthesias are detected, Skin: There are no ulcer or rashes noted. Psychiatric: The patient has normal affect. Cardiovascular: There is a regular rate and rhythm without significant murmur appreciated. No carotid bruits   Diagnostic Studies Duplex ultrasound of the abdomen was performed today. This was a technically difficult study. Both renal arteries appear to be widely patent. No segmental velocity increase was identified within the left renal artery.  Assessment: Status post left renal artery stent Plan: The patient is doing very well today. She has not had a significant improvement in her creatinine, although her blood pressure has been better controlled since stenting has occurred. Ultrasound today shows no significant problems with her left renal artery. She is tolerating her Plavix, and therefore I will continue her on this. She will followup in one year with a repeat ultrasound.  Jorge Ny, M.D. [facility] [place] Office: [phone] Pager:  [phone]

## 2013-06-25 ENCOUNTER — Encounter: Payer: Self-pay | Admitting: Nephrology

## 2013-08-02 ENCOUNTER — Other Ambulatory Visit: Payer: Self-pay

## 2013-08-02 DIAGNOSIS — Z48812 Encounter for surgical aftercare following surgery on the circulatory system: Secondary | ICD-10-CM

## 2013-08-02 DIAGNOSIS — I701 Atherosclerosis of renal artery: Secondary | ICD-10-CM

## 2013-08-09 ENCOUNTER — Encounter: Payer: Self-pay | Admitting: Family

## 2013-08-12 ENCOUNTER — Other Ambulatory Visit: Payer: Self-pay | Admitting: Gastroenterology

## 2013-08-12 ENCOUNTER — Encounter (INDEPENDENT_AMBULATORY_CARE_PROVIDER_SITE_OTHER): Payer: Medicare Other | Admitting: *Deleted

## 2013-08-12 ENCOUNTER — Other Ambulatory Visit (INDEPENDENT_AMBULATORY_CARE_PROVIDER_SITE_OTHER): Payer: Medicare Other | Admitting: *Deleted

## 2013-08-12 ENCOUNTER — Encounter: Payer: Self-pay | Admitting: Family

## 2013-08-12 ENCOUNTER — Ambulatory Visit (INDEPENDENT_AMBULATORY_CARE_PROVIDER_SITE_OTHER): Payer: Medicare Other | Admitting: Family

## 2013-08-12 DIAGNOSIS — Z48812 Encounter for surgical aftercare following surgery on the circulatory system: Secondary | ICD-10-CM

## 2013-08-12 DIAGNOSIS — I6529 Occlusion and stenosis of unspecified carotid artery: Secondary | ICD-10-CM

## 2013-08-12 DIAGNOSIS — I701 Atherosclerosis of renal artery: Secondary | ICD-10-CM

## 2013-08-12 NOTE — Patient Instructions (Addendum)
Renal Artery Stenosis Renal artery stenosis (RAS) is the narrowing of the artery that supplies blood to the kidney. If the narrowing is critical and the kidney does not get enough blood, hypertension (high blood pressure) can develop. This is called renal vascular hypertension (RVH). This is a common, uncommon cause of secondary hypertension. It does not usually happen until there is at least a 70% narrowing of the artery. Decreased blood flow through the renal artery causes the kidney to release increased amounts of a hormone. It is called renin. Renin is a strong blood pressure regulator. When it is high, it causes changes that lead to hypertension. Eventually the kidney not receiving enough blood may shrink in size and become less useful. The high blood pressure that is produced can eventually damage and destroy the remaining kidney. This is called hypertensive nephrosclerosis. If both kidneys fail, it will lead to chronic renal failure.  CAUSES  Most renal artery stenosis is caused by a hardening of the arteries (atherosclerosis). This is called Atherosclerotic Renal Artery Stenosis (AS-RAS). It is caused by a build-up of cholesterol (plaques) on the inner lining of the renal artery. A much less common cause is Fibromuscular Dysplasia (FMD). With it, there is an abnormality in the muscular lining of the renal artery. FMD-RAS occurs almost exclusively in women aged 71 to 29. It rarely affects African Americans or Asians.  SYMPTOMS  Often high blood pressure is discovered on a routine blood pressure check. It may be the only sign that something is wrong. Other problems that may occur are:  You may develop calf pain when walking. This is called intermittent claudication. It may be a sign of bad circulation in the legs.  Inability to use certain blood pressure pills such as angiotensin-I (ACE-I) inhibitors or angiotensin receptor blockers (ARB's). These could cause sudden drops in blood pressure with  worsening of kidney function.  More than three antihypertensive medications may be needed for blood pressure control.  New onset of high blood pressure if you are over 55. DIAGNOSIS  Your caregiver may find suggestions of this on exam if he finds bruits (like murmurs) on listening to your abdomen (belly) or the large arteries in your neck. Your caregiver may also suspect this there is a sudden worsening of your blood pressure when it has been well controlled and you are over age 22. Additional testing that may be done includes:  One diagnostic method used for renal artery stenosis (RAS) is to measure and compare the level of renin, (blood pressure-regulating hormone released by the kidneys), in the right to the left renal veins. If the amount of renin released by one-side is markedly higher than the other, this identifies a high renin-releasing kidney consistent with RAS.  FMD-RAS is often found on renal scan with ACE-inhibitor challenge, or ultrasound with Doppler.  FMD responds well to angioplasty and stenting. The results of stenting in FMD are usually long lasting. RISK FACTORS  Most renal artery stenosis is caused by a hardening of the arteries. This is called atherosclerosis. Other risk factors associated with the development of atherosclerotic RAS include the following:   Carotid artery disease.  Obesity.  High blood pressure.  Heredity.  Old age.  Fibromuscular dysplasia.  Diabetes mellitus.  Smoking.  Hardening of the arteries. TREATMENT   Renal vascular hypertension can be very severe. It can also be difficult to control.  Medication is used to control high blood pressure (hypertension). Blood pressure medications that directly affect the renin angiotensin pathway  can be used toe help control blood pressure. ACE inhibitors and angiotensin receptor blockers (ARB's) are often effective in patients with unilateral RAS. In some cases, patients with RAS are resistant to  these medications.  In patients with bilateral RAS, these medications must be used carefully. They may cause acute renal failure (ARF). If acute renal failure develops (if creatinine increases by more than 30%), the medication is discontinued. The patient is evaluated for bilateral RAS.  Angioplasty and stenting may be used to improve blood flow. The goal is to improve the circulation of blood flow to the kidney and prevent the release of excess renin, which can help to decrease blood pressure. This helps to prevent atrophy of the kidney. In general, patients with AS-RAS should have stenting done. This is because plasty by itself has a high incidence of re-stenosis.  Surgery to bypass the narrowing may be done. If the kidney with RAS has diminished in size or strength (atrophied ), surgical removal of the kidney may be advised. This is called nephrectomy. Document Released: 08/17/2005 Document Revised: 02/13/2012 Document Reviewed: 11/20/2008 [facility] Patient Information 2014 [place], [place].   Stroke Prevention Some medical conditions and behaviors are associated with an increased chance of having a stroke. You may prevent a stroke by making healthy choices and managing medical conditions. Reduce your risk of having a stroke by:  Staying physically active. Get at least 30 minutes of activity on most or all days.  Not smoking. It may also be helpful to avoid exposure to secondhand smoke.  Limiting alcohol use. Moderate alcohol use is considered to be:  No more than 2 drinks per day for men.  No more than 1 drink per day for nonpregnant women.  Eating healthy foods.  Include 5 or more servings of fruits and vegetables a day.  Certain diets may be prescribed to address high blood pressure, high cholesterol, diabetes, or obesity.  Managing your cholesterol levels.  A low-saturated fat, low-trans fat, low-cholesterol, and high-fiber diet may control cholesterol levels.  Take any  prescribed medicines to control cholesterol as directed by your caregiver.  Managing your diabetes.  A controlled-carbohydrate, controlled-sugar diet is recommended to manage diabetes.  Take any prescribed medicines to control diabetes as directed by your caregiver.  Controlling your high blood pressure (hypertension).  A low-salt (sodium), low-saturated fat, low-trans fat, and low-cholesterol diet is recommended to manage high blood pressure.  Take any prescribed medicines to control hypertension as directed by your caregiver.  Maintaining a healthy weight.  A reduced-calorie, low-sodium, low-saturated fat, low-trans fat, low-cholesterol diet is recommended to manage weight.  Stopping drug abuse.  Avoiding hormone replacementl pills.  Talk to your caregiver about the risks of taking birth control pills if you are over 68 years old, smoke, get migraines, or have ever had a blood clot.  Getting evaluated for sleep disorders (sleep apnea).  Talk to your caregiver about getting a sleep evaluation if you snore a lot or have excessive sleepiness.  Taking medicines as directed by your caregiver.  For some people, aspirin or blood thinners (anticoagulants) are helpful in reducing the risk of forming abnormal blood clots that can lead to stroke. If you have the irregular heart rhythm of atrial fibrillation, you should be on a blood thinner unless there is a good reason you cannot take them.  Understand all your medicine instructions. SEEK IMMEDIATE MEDICAL CARE IF:   You have sudden weakness or numbness of the face, arm, or leg, especially on one side  of the body.  You have sudden confusion.  You have trouble speaking (aphasia) or understanding.  You have sudden trouble seeing in one or both eyes.  You have sudden trouble walking.  You have dizziness.  You have a loss of balance or coordination.  You have a sudden, severe headache with no known cause.  You have new chest  pain or an irregular heartbeat. Any of these symptoms may represent a serious problem that is an emergency. Do not wait to see if the symptoms will go away. Get medical help right away. Call your local emergency services (911 in [place] Do not drive yourself to the hospital. Document Released: 12/29/2004 Document Revised: 02/13/2012 Document Reviewed: 07/11/2011 [facility] Patient Information 2014 [place], [place].

## 2013-08-12 NOTE — Progress Notes (Signed)
[facility] [place] HISTORY AND PHYSICAL   MRN : [id number]  History of Present Illness:   Rebecca Tate is a 77 y.o. female patient of Dr. Sandy Salaam who follows up for Duplex of carotid arteries. AAA Duplex was included in the renal artery Duplex done 06/17/13 which demonstrated largest AAA measurement as 3.7 cm which was unchanged from a year prior.  A left renal artery stent was placed in November, 2013  in the setting of difficult to control blood pressure and renal insufficiency. It was difficult to place. Right groin access was attempted on 2 separate occasions, and ultimately was placed from the left brachial approach. After the stent placement the patient had reported that her blood pressures were better. Her creatinine was stable at 2.8 in May, 2014, GFR 15 or 18, Stage 4 CKD. The bruising which she initially had from starting Plavix had improved. She continued to take her Plavix.  She had not had a significant improvement in her creatinine, although her blood pressure had been better controlled since stenting has occurred. Ultrasound in July, 2014 showed no significant problems with her left renal artery. She was tolerating her Plavix, and therefore it was continued.  Her nephrologist is Dr. Arrie Aran. She checks her blood pressure bid at home in her right arm only, states she has blockage for many years in a blood vessel on the left side. States her blood pressures at home run 133/69 or 140/70 in her right arm. She reports that she faxes her blood pressure readings q 2 weeks to her nephrologist office. She denies steal symptoms in left hand/arm. Will see gastroenterologist today for abdominal pain and diarrhea. Loss of appetite, stomach is "growing". Denies pain after eating. Denies significant weight loss, couple pounds in a few months.     Pt. denies claudication at in legs.  Pt. denies rest pain; denies night pain denies non healing ulcers on  extremities. Patient has Negative history of TIA or stroke symptom.  The patient denies amaurosis fugax or monocular blindness.  The patient  denies facial drooping.  Pt. denies hemiplegia.  The patient denies receptive or expressive aphasia.  Pt. denies weakness.  patient has not had sudden back or abdominal pain.  Pt Diabetic: No Pt smoker: former smoker, quit 2010  Current Outpatient Prescriptions  Medication Sig Dispense Refill  . amLODipine (NORVASC) 10 MG tablet Take 10 mg by mouth daily.        Marland Kitchen aspirin (ASPIRIN EC) 81 MG EC tablet Take 81 mg by mouth daily. Swallow whole.       . cholecalciferol (VITAMIN D) 1000 UNITS tablet Take 1,000 Units by mouth daily.      . clopidogrel (PLAVIX) 75 MG tablet Take 1 tablet (75 mg total) by mouth daily.  30 tablet  6  . dorzolamide (TRUSOPT) 2 % ophthalmic solution Place 1 drop into both eyes 2 (two) times daily.      Marland Kitchen doxercalciferol (HECTOROL) 0.5 MCG capsule Take 1 mcg by mouth daily.      Marland Kitchen ezetimibe (ZETIA) 10 MG tablet Take 10 mg by mouth daily.      Marland Kitchen levothyroxine (SYNTHROID, LEVOTHROID) 75 MCG tablet Take 75 mcg by mouth daily.        . metoprolol succinate (TOPROL-XL) 25 MG 24 hr tablet Take 25 mg by mouth daily.        No current facility-administered medications for this visit.  Is not taking ASA.  Pt meds include: Statin :No Betablocker:  Yes ASA: No Other anticoagulants/antiplatelets: Plavix  Past Medical History  Diagnosis Date  . Hypertension   . Hypothyroidism     thyroid nodule  . Hypercholesteremia   . Cataracts, bilateral   . Hx of adenomatous colonic polyps   . AAA (abdominal aortic aneurysm)   . Gallstones   . Glaucoma   . ANA positive   . Carotid artery occlusion   . Chronic kidney disease   . Shortness of breath     "  at times"    Past Surgical History  Procedure Laterality Date  . Appendectomy    . Colonoscopy    . Eus  06/20/2012    Procedure: ESOPHAGEAL ENDOSCOPIC ULTRASOUND (EUS) RADIAL;   Surgeon: Willis Modena, MD;  Location: [facility];  Service: Endoscopy;  Laterality: N/A;  . Abdominal hysterectomy  1975    partial    Social History History  Substance Use Topics  . Smoking status: Former Smoker -- 0.50 packs/day    Types: Cigarettes    Quit date: 12/05/2008  . Smokeless tobacco: Never Used  . Alcohol Use: Yes     Comment: rare glass of wine    Family History Family History  Problem Relation Age of Onset  . Heart disease Mother   . Hypertension Mother   . Diabetes Mother   . Heart attack Father   . Diabetes Sister   . Hypertension Sister   . Diabetes Brother     Amputation  . Heart disease Brother     Heart Disease before age 5  . Hypertension Brother   . Heart attack Brother     Allergies  Allergen Reactions  . Hydralazine Other (See Comments)    Liver inflamed  . Penicillins     REACTION: hives/itch     REVIEW OF SYSTEMS  General: [ ]  Weight loss, [ ]  Fever, [ ]  chills Neurologic: [ ]  Dizziness, [ ]  Blackouts, [ ]  Seizure [ ]  Stroke, [ ]  "Mini stroke", [ ]  Slurred speech, [ ]  Temporary blindness; [ ]  weakness in arms or legs, [ ]  Hoarseness [ ]  Dysphagia Cardiac: [ ]  Chest pain/pressure, [ ]  Shortness of breath at rest [ ]  Shortness of breath with exertion, [ ]  Atrial fibrillation or irregular heartbeat  Vascular: [ ]  Pain in legs with walking, [ ]  Pain in legs at rest, [ ]  Pain in legs at night,  [ ]  Non-healing ulcer, [ ]  Blood clot in vein/DVT,   Pulmonary: [ ]  Home oxygen, [ ]  Productive cough, [ ]  Coughing up blood, [ ]  Asthma,  [ ]  Wheezing [ ]  COPD Musculoskeletal:  Arly.Keller ] Arthritis, [ ]  Low back pain, [ ]  Joint pain Hematologic: Arly.Keller ] Easy Bruising, [ ]  Anemia; [ ]  Hepatitis Gastrointestinal: [ ]  Blood in stool, [ ]  Gastroesophageal Reflux/heartburn, Urinary: Arly.Keller ] chronic Kidney disease, [ ]  on HD - [ ]  MWF or [ ]  TTHS, [ ]  Burning with urination, [ ]  Difficulty urinating Skin: [ ]  Rashes, [ ]  Wounds Psychological: [ ]  Anxiety, [  ] Depression  Physical Examination  Filed Vitals:   08/12/13 0959  BP: 118/62  Pulse: 62  Resp:    Filed Weights   08/12/13 0952  Weight: 139 lb (63.05 kg)   Body mass index is 23.85 kg/(m^2).  General:  WDWN in NAD Gait: Normal HENT: WNL,   Eyes: Pupils equal Pulmonary: normal non-labored breathing , without Rales, rhonchi,  wheezing Cardiac: RRR, without  Murmurs, rubs or gallops; No carotid  bruits Abdomen: soft, no masses palpated, mild to moderate tenderness in both lower quadrants on palpation. Skin: no rashes, ulcers noted;  no Gangrene , no cellulitis; no open wounds;   Vascular Exam/Pulses: VASCULAR EXAM  Carotid Bruits Left Right   Negative Negative                             VASCULAR EXAM: Extremities without ischemic changes  without Gangrene; without open wounds.  Radial pulses:      Left: not palpable      Right: palpable Brachial pulses:   Left: palpable, no bruising           Right: palpable                                                                                                          LE Pulses LEFT RIGHT       FEMORAL   Palpable   Palpable, no bruising or raised area        POPLITEAL  not palpable   not palpable       POSTERIOR TIBIAL   palpable    palpable        DORSALIS PEDIS      ANTERIOR TIBIAL  palpable    palpable      Musculoskeletal: no muscle wasting or atrophy; no edema  Neurologic: A&O X 3; Appropriate Affect ;  SENSATION: normal; MOTOR FUNCTION: 5/5 Symmetric, CN 2-12 intact Speech is fluent/normal   Non-Invasive Vascular Imaging:  Bilateral carotid Duplex: Evidence of <40% ICA stenosis; however plaque is calcific. Left CCA stenosis is present. Known stenosis of left subclavian artery with a stable brachial blood pressure gradient.  ASSESSMENT: Increase in velocities of the left common carotid artery.  After discussing with Dr. Myra Gianotti and based on today's exam and Duplex results, patient advised to return in  1 year for bilateral carotid Duplex and bilateral aorto-renal artery Duplex.   PLAN:   I discussed in depth with the patient the nature of atherosclerosis, and emphasized the importance of maximal medical management including strict control of blood pressure, blood glucose, and lipid levels, obtaining regular exercise, and continued cessation of smoking.  The patient is aware that without maximal medical management the underlying atherosclerotic disease process will progress, limiting the benefit of any interventions. Return in 1 year for bilateral carotid Duplex and bilateral aorto-renal artery Duplex.  Patient was given information about stroke prevention and renal artery stenosis.    Charisse March, RN, MSN, FNP-C [facility] Office: [phone]  Clinic MD: Myra Gianotti 08/12/2013 9:22 AM

## 2013-08-14 ENCOUNTER — Encounter (HOSPITAL_COMMUNITY): Payer: Self-pay | Admitting: *Deleted

## 2013-08-20 ENCOUNTER — Encounter (HOSPITAL_COMMUNITY): Payer: Self-pay | Admitting: Pharmacy Technician

## 2013-09-03 ENCOUNTER — Ambulatory Visit (HOSPITAL_COMMUNITY): Payer: Medicare Other | Admitting: Anesthesiology

## 2013-09-03 ENCOUNTER — Encounter (HOSPITAL_COMMUNITY): Payer: Self-pay

## 2013-09-03 ENCOUNTER — Ambulatory Visit (HOSPITAL_COMMUNITY)
Admission: RE | Admit: 2013-09-03 | Discharge: 2013-09-03 | Disposition: A | Discharge status: Home / Self Care | Payer: Medicare Other | Source: Ambulatory Visit | Attending: Gastroenterology | Admitting: Gastroenterology

## 2013-09-03 ENCOUNTER — Encounter (HOSPITAL_COMMUNITY)
Admission: RE | Disposition: A | Discharge status: Home / Self Care | Payer: Self-pay | Source: Ambulatory Visit | Attending: Gastroenterology

## 2013-09-03 ENCOUNTER — Encounter (HOSPITAL_COMMUNITY): Payer: Self-pay | Admitting: Anesthesiology

## 2013-09-03 DIAGNOSIS — D126 Benign neoplasm of colon, unspecified: Secondary | ICD-10-CM | POA: Insufficient documentation

## 2013-09-03 DIAGNOSIS — K5730 Diverticulosis of large intestine without perforation or abscess without bleeding: Secondary | ICD-10-CM | POA: Insufficient documentation

## 2013-09-03 DIAGNOSIS — J4489 Other specified chronic obstructive pulmonary disease: Secondary | ICD-10-CM | POA: Insufficient documentation

## 2013-09-03 DIAGNOSIS — E7800 Pure hypercholesterolemia, unspecified: Secondary | ICD-10-CM | POA: Insufficient documentation

## 2013-09-03 DIAGNOSIS — I739 Peripheral vascular disease, unspecified: Secondary | ICD-10-CM | POA: Insufficient documentation

## 2013-09-03 DIAGNOSIS — R197 Diarrhea, unspecified: Secondary | ICD-10-CM | POA: Insufficient documentation

## 2013-09-03 DIAGNOSIS — N189 Chronic kidney disease, unspecified: Secondary | ICD-10-CM | POA: Insufficient documentation

## 2013-09-03 DIAGNOSIS — J449 Chronic obstructive pulmonary disease, unspecified: Secondary | ICD-10-CM | POA: Insufficient documentation

## 2013-09-03 DIAGNOSIS — E039 Hypothyroidism, unspecified: Secondary | ICD-10-CM | POA: Insufficient documentation

## 2013-09-03 DIAGNOSIS — I129 Hypertensive chronic kidney disease with stage 1 through stage 4 chronic kidney disease, or unspecified chronic kidney disease: Secondary | ICD-10-CM | POA: Insufficient documentation

## 2013-09-03 HISTORY — PX: COLONOSCOPY WITH PROPOFOL: SHX5780

## 2013-09-03 SURGERY — COLONOSCOPY WITH PROPOFOL
Anesthesia: Monitor Anesthesia Care

## 2013-09-03 MED ORDER — LACTATED RINGERS IV SOLN
INTRAVENOUS | Status: DC
  Administered 2013-09-03: 09:00:00 via INTRAVENOUS

## 2013-09-03 MED ORDER — SODIUM CHLORIDE 0.9 % IV SOLN: INTRAVENOUS | Status: DC

## 2013-09-03 MED ORDER — MIDAZOLAM HCL 5 MG/5ML IJ SOLN
INTRAMUSCULAR | Status: DC | PRN
  Administered 2013-09-03: 2 mg via INTRAVENOUS

## 2013-09-03 MED ORDER — PROPOFOL INFUSION 10 MG/ML OPTIME
INTRAVENOUS | Status: DC | PRN
  Administered 2013-09-03: 50 ug/kg/min via INTRAVENOUS

## 2013-09-03 MED ORDER — KETAMINE HCL 50 MG/ML IJ SOLN
INTRAMUSCULAR | Status: DC | PRN
  Administered 2013-09-03: 25 mg via INTRAMUSCULAR

## 2013-09-03 SURGICAL SUPPLY — 22 items

## 2013-09-03 NOTE — Transfer of Care (Signed)
Immediate Anesthesia Transfer of Care Note  Patient: Rebecca Tate  Procedure(s) Performed: Procedure(s): COLONOSCOPY WITH PROPOFOL (N/A)  Patient Location pacu  Anesthesia Type:MAC  Level of Consciousness: sedated  Airway & Oxygen Therapy: Patient Spontanous Breathing and Patient connected to nasal cannula oxygen  Post-op Assessment: Report given to PACU RN and Post -op Vital signs reviewed and stable  Post vital signs: Reviewed and stable  Complications: No apparent anesthesia complications

## 2013-09-03 NOTE — Anesthesia Postprocedure Evaluation (Signed)
  Anesthesia Post-op Note  Patient: Rebecca Tate  Procedure(s) Performed: Procedure(s) (LRB): COLONOSCOPY WITH PROPOFOL (N/A)  Patient Location: PACU  Anesthesia Type: MAC  Level of Consciousness: awake and alert   Airway and Oxygen Therapy: Patient Spontanous Breathing  Post-op Pain: mild  Post-op Assessment: Post-op Vital signs reviewed, Patient's Cardiovascular Status Stable, Respiratory Function Stable, Patent Airway and No signs of Nausea or vomiting  Last Vitals:  Filed Vitals:   09/03/13 1030  BP: 159/61  Temp:   Resp: 16    Post-op Vital Signs: stable   Complications: No apparent anesthesia complications

## 2013-09-03 NOTE — Op Note (Signed)
Problem: Chronic diarrhea. History of adenomatous colon polyps removed colonoscopically in 2009  Endoscopist: Danise Edge  Premedication: Propofol administered by anesthesia  Procedure: Diagnostic colonoscopy with random colon biopsies and colon polyp removal The patient was placed in the left lateral decubitus position. Anal inspection and digital rectal exam were Pentax pediatric colonoscope was introduced into the rectum and advanced to the cecum. A normal-appearing ileocecal valve and appendiceal orifice were identified. Colonic preparation for the exam today was good.  Rectum. Normal. Retroflex view of the distal rectum normal.  Sigmoid colon and descending colon. Normal.  Splenic flexure. Normal.  Transverse colon. From the mid transverse colon a 5 mm sessile polyp was removed with the cold snare and submitted for pathological interpretation  Hepatic flexure. Normal.  Ascending colon. Normal.  Cecum and ileocecal valve. Normal.  Universal colonic diverticulosis was present.  Colonic biopsies. Random colonic biopsies were performed from the right colon and left colon to look for microscopic colitis  Assessment:  #1. From the mid transverse colon, a 5 mm sessile polyp was removed with the cold snare  #2. Universal colonic diverticulosis  #3. Random colon biopsies performed to look for microscopic colitis  Recommendations: Schedule surveillance colonoscopy in 5 years.

## 2013-09-03 NOTE — Anesthesia Preprocedure Evaluation (Addendum)
Anesthesia Evaluation  Patient identified by MRN, date of birth, ID band Patient awake    Reviewed: Allergy & Precautions, H&P , NPO status , Patient's Chart, lab work & pertinent test results  Airway Mallampati: II TM Distance: >3 FB Neck ROM: Full    Dental  (+) Upper Dentures and Lower Dentures   Pulmonary shortness of breath, COPD breath sounds clear to auscultation  Pulmonary exam normal       Cardiovascular hypertension, Pt. on medications and Pt. on home beta blockers + Peripheral Vascular Disease negative cardio ROS  Rhythm:Regular Rate:Normal     Neuro/Psych negative neurological ROS  negative psych ROS   GI/Hepatic negative GI ROS, Neg liver ROS,   Endo/Other  Hypothyroidism   Renal/GU Renal disease  negative genitourinary   Musculoskeletal negative musculoskeletal ROS (+)   Abdominal   Peds negative pediatric ROS (+)  Hematology negative hematology ROS (+)   Anesthesia Other Findings   Reproductive/Obstetrics negative OB ROS                          Anesthesia Physical Anesthesia Plan  ASA: III  Anesthesia Plan: MAC   Post-op Pain Management:    Induction:   Airway Management Planned:   Additional Equipment:   Intra-op Plan:   Post-operative Plan:   Informed Consent: I have reviewed the patients History and Physical, chart, labs and discussed the procedure including the risks, benefits and alternatives for the proposed anesthesia with the patient or authorized representative who has indicated his/her understanding and acceptance.   Dental advisory given  Plan Discussed with: CRNA  Anesthesia Plan Comments:         Anesthesia Quick Evaluation

## 2013-09-03 NOTE — H&P (Signed)
  Problem: Chronic diarrhea. History of adenomatous colon polyps.  History: The patient is a 77 year old female born 10-Mar-1936. In March 2009, the patient underwent a colonoscopy with removal of small adenomatous colon polyps.  The patient has chronic kidney disease and was prescribed vitamin D in April 2014. She started experiencing nonbloody diarrhea shortly after starting her vitamin. Despite switching to a different vitamin preparation, the patient continues to have intermittent nonbloody diarrhea. Her weight remains stable. She does experience of lower abdominal discomfort with diarrhea.  The patient is scheduled to undergo a surveillance colonoscopy with performance of random colon biopsies to rule out microscopic colitis.  Past medical history: Thyroid nodule. Hypercholesterolemia. Hypertension. Hypothyroidism. Cataracts. Adenomatous colon polyps. Chronic kidney disease. Abdominal aortic aneurysm. Gallstones. Glaucoma. Renal artery stenosis. Subclavian steal syndrome. Hysterectomy. Appendectomy. Adenomatous colon polyps removed colonoscopically.  Allergies: Penicillin causes itching. Hydralazine causes elevation in liver transaminases.  Exam: The patient is alert and lying comfortably on the endoscopy stretcher. Abdomen is soft and nontender to palpation. Lungs are clear to auscultation. Cardiac exam reveals a regular rhythm  Plan: Proceed with surveillance colonoscopy with performance of random colon biopsies to rule out microscopic colitis as a cause for the patient's chronic diarrhea.

## 2013-09-04 ENCOUNTER — Encounter (HOSPITAL_COMMUNITY): Payer: Self-pay | Admitting: Gastroenterology

## 2013-11-16 ENCOUNTER — Other Ambulatory Visit: Payer: Self-pay | Admitting: Surgery

## 2013-12-18 ENCOUNTER — Other Ambulatory Visit: Payer: Self-pay | Admitting: Surgery

## 2014-01-11 ENCOUNTER — Other Ambulatory Visit: Payer: Self-pay | Admitting: Surgery

## 2014-01-22 ENCOUNTER — Other Ambulatory Visit: Payer: Self-pay | Admitting: Surgery

## 2014-01-22 ENCOUNTER — Other Ambulatory Visit (HOSPITAL_COMMUNITY): Payer: Self-pay | Admitting: Surgery

## 2014-01-22 DIAGNOSIS — I6529 Occlusion and stenosis of unspecified carotid artery: Secondary | ICD-10-CM

## 2014-02-26 ENCOUNTER — Other Ambulatory Visit: Payer: Self-pay | Admitting: Surgery

## 2014-03-04 ENCOUNTER — Other Ambulatory Visit: Payer: Self-pay | Admitting: *Deleted

## 2014-03-04 ENCOUNTER — Telehealth: Payer: Self-pay

## 2014-03-04 DIAGNOSIS — I714 Abdominal aortic aneurysm, without rupture, unspecified: Secondary | ICD-10-CM

## 2014-03-04 NOTE — Telephone Encounter (Signed)
Discussed pt's. symptoms w/ Dr. Kellie Simmering.  Recommended to change the 1 yr. F/u of AAA to a 6 mo. F/u.  Also recommends that pt. contact her PCP with the c/o severe abdominal pain, nausea, and diarrhea.  Notified pt. of recommendations per Dr. Kellie Simmering, and that she will receive a phonecall from the office, informing her of her appt.  Stated she has appt. with her PCP tomorrow.  Verb. understanding of recommendations.

## 2014-03-04 NOTE — Telephone Encounter (Signed)
Phone call from pt. With c/o "severe", intermittent abdominal pain; reports it occurs in the middle abdominal area.  Reports the abdominal pain has been occurring x approx. 2 mos.  Also reports nausea ongoing for approx. 1 mo.  Denies vomiting; denies fever.  Reports has chills, and "stays cold since taking Plavix."   Reports the pain is so severe when it occurs that she can hardly move, and can last 10-15 minutes.  Denies any pain with palpation of mid abdomen; states "it is on the inside." Denies abdominal bloating.  States has diarrhea and has had this for several mos.  Reports had a Colonoscopy last September.  Was instructed by her PCP recently to contact [facility] about her abdominal pain.  Advised will discuss with office MD and call back with further recommendations.

## 2014-03-05 ENCOUNTER — Other Ambulatory Visit: Payer: Self-pay | Admitting: *Deleted

## 2014-03-05 ENCOUNTER — Encounter: Payer: Self-pay | Admitting: Family

## 2014-03-05 ENCOUNTER — Encounter (INDEPENDENT_AMBULATORY_CARE_PROVIDER_SITE_OTHER): Payer: Self-pay

## 2014-03-05 ENCOUNTER — Other Ambulatory Visit: Payer: Self-pay | Admitting: Surgery

## 2014-03-05 ENCOUNTER — Ambulatory Visit (INDEPENDENT_AMBULATORY_CARE_PROVIDER_SITE_OTHER): Payer: Medicare Other | Admitting: Family

## 2014-03-05 ENCOUNTER — Ambulatory Visit (INDEPENDENT_AMBULATORY_CARE_PROVIDER_SITE_OTHER)
Admission: RE | Admit: 2014-03-05 | Discharge: 2014-03-05 | Disposition: A | Discharge status: Home / Self Care | Payer: Medicare Other | Source: Ambulatory Visit | Attending: Family | Admitting: Family

## 2014-03-05 ENCOUNTER — Ambulatory Visit (HOSPITAL_COMMUNITY)
Admission: RE | Admit: 2014-03-05 | Discharge: 2014-03-05 | Disposition: A | Discharge status: Home / Self Care | Payer: Medicare Other | Source: Ambulatory Visit | Attending: Family | Admitting: Family

## 2014-03-05 VITALS — BP 152/74 | HR 64 | Resp 16 | Ht 63.5 in | Wt 138.0 lb

## 2014-03-05 DIAGNOSIS — I701 Atherosclerosis of renal artery: Secondary | ICD-10-CM | POA: Insufficient documentation

## 2014-03-05 DIAGNOSIS — I714 Abdominal aortic aneurysm, without rupture, unspecified: Secondary | ICD-10-CM

## 2014-03-05 DIAGNOSIS — R109 Unspecified abdominal pain: Secondary | ICD-10-CM | POA: Insufficient documentation

## 2014-03-05 DIAGNOSIS — Z48812 Encounter for surgical aftercare following surgery on the circulatory system: Secondary | ICD-10-CM | POA: Insufficient documentation

## 2014-03-05 LAB — CREATININE, SERUM: CREATININE: 3.31 mg/dL — AB (ref 0.50–1.10)

## 2014-03-05 LAB — BUN: BUN: 46 mg/dL — ABNORMAL HIGH (ref 6–23)

## 2014-03-05 NOTE — Progress Notes (Signed)
[facility] [place] HISTORY AND PHYSICAL   MRN : [id number]  History of Present Illness:   Rebecca Tate is a 78 y.o. female patient of Dr. Chrystie Nose who follows up for Duplex of carotid arteries. AAA Duplex was included in the renal artery Duplex done 06/17/13 which demonstrated largest AAA measurement as 3.7 cm which was unchanged from a year prior. A left renal artery stent was placed in November, 2013 in the setting of difficult to control blood pressure and renal insufficiency. It was difficult to place. Right groin access was attempted on 2 separate occasions, and ultimately was placed from the left brachial approach. She is having severe intermittent epigastric pain that lasts 10-12 minutes, started about 2 months ago, not increasing in frequency or severity, denies back pain. She denies history of GI bleed, GI ulcers, or any known GI problems other than gallstones. She states her blood pressure has been good, about 130's/60's.  She is scheduled for carotid Duplex in 6 months.   Pt. denies claudication in legs, denies non healing wounds.   Patient has Negative history of TIA or stroke symptom.  The patient denies amaurosis fugax or monocular blindness.  The patient  denies facial drooping.  Pt. denies hemiplegia.  The patient denies receptive or expressive aphasia.    Pt Diabetic: No Pt smoker: former smoker, quit 4 years ago  Current Outpatient Prescriptions  Medication Sig Dispense Refill  . amLODipine (NORVASC) 10 MG tablet Take 10 mg by mouth every morning.       . clopidogrel (PLAVIX) 75 MG tablet TAKE ONE TABLET BY MOUTH ONCE DAILY  30 tablet  0  . dorzolamide (TRUSOPT) 2 % ophthalmic solution Place 1 drop into both eyes 2 (two) times daily.      Marland Kitchen doxercalciferol (HECTOROL) 0.5 MCG capsule Take 1 mcg by mouth daily.      Marland Kitchen levothyroxine (SYNTHROID, LEVOTHROID) 75 MCG tablet Take 75 mcg by mouth every morning.       . metoprolol succinate (TOPROL-XL)  25 MG 24 hr tablet Take 25 mg by mouth every morning.        No current facility-administered medications for this visit.    Pt meds include: Statin :No, causes myalgias  ASA: No Other anticoagulants/antiplatelets: Plavix  Past Medical History  Diagnosis Date  . Hypertension   . Hypothyroidism     thyroid nodule  . Hypercholesteremia   . Cataracts, bilateral   . Hx of adenomatous colonic polyps   . AAA (abdominal aortic aneurysm)   . Gallstones   . Glaucoma   . ANA positive   . Carotid artery occlusion   . Shortness of breath     "  at timesof exertion"  . Chronic kidney disease     left renal artery stent    Past Surgical History  Procedure Laterality Date  . Appendectomy    . Colonoscopy    . Eus  06/20/2012    Procedure: ESOPHAGEAL ENDOSCOPIC ULTRASOUND (EUS) RADIAL;  Surgeon: Arta Silence, MD;  Location: [facility];  Service: Endoscopy;  Laterality: N/A;  . Abdominal hysterectomy  1975    partial  . Renal artery stent Left     November, 2013, Dr. Trula Slade  . Colonoscopy with propofol N/A 09/03/2013    Procedure: COLONOSCOPY WITH PROPOFOL;  Surgeon: Garlan Fair, MD;  Location: [facility];  Service: Endoscopy;  Laterality: N/A;    Social History History  Substance Use Topics  . Smoking status: Former Smoker --  0.50 packs/day    Types: Cigarettes    Quit date: 12/05/2008  . Smokeless tobacco: Never Used  . Alcohol Use: Yes     Comment: rare glass of wine    Family History Family History  Problem Relation Age of Onset  . Heart disease Mother   . Hypertension Mother   . Diabetes Mother   . Heart attack Father   . Diabetes Sister   . Hypertension Sister   . Diabetes Brother     Amputation  . Heart disease Brother     Heart Disease before age 63  . Hypertension Brother   . Heart attack Brother     Allergies  Allergen Reactions  . Hydralazine Other (See Comments)    Liver inflamed  . Penicillins     REACTION: hives/itch     REVIEW  OF SYSTEMS: See HPI for pertinent positives and negatives.  Physical Examination Filed Vitals:   03/05/14 1049  BP: 152/74  Pulse: 64  Resp: 16  Height: 5' 3.5" (1.613 m)  Weight: 138 lb (62.596 kg)  SpO2: 100%   Body mass index is 24.06 kg/(m^2).  General:  WDWN in NAD Gait: Normal HENT: WNL Eyes: Pupils equal Pulmonary: normal non-labored breathing , without Rales, rhonchi,  wheezing Cardiac: RRR, no murmurs detected  Abdomen: soft, NT, no masses Skin: no rashes, ulcers noted;  no Gangrene , no cellulitis; no open wounds;   VASCULAR EXAM  Carotid Bruits Left Right   Positive Positive                             VASCULAR EXAM: Extremities without ischemic changes  without Gangrene; without open wounds.                                                                                                          LE Pulses LEFT RIGHT       FEMORAL   palpable   palpable        POPLITEAL  not palpable   not palpable       POSTERIOR TIBIAL  not palpable    palpable        DORSALIS PEDIS      ANTERIOR TIBIAL  palpable   palpable     Musculoskeletal: no muscle wasting or atrophy; no edema  Neurologic: A&O X 3; Appropriate Affect ;  SENSATION: normal; MOTOR FUNCTION: 5/5 Symmetric, CN 2-12 intact Speech is fluent/normal  Non-Invasive Vascular Imaging (03/05/2014): ABDOMINAL AORTA DUPLEX EVALUATION    INDICATION: Aortic aneurysm    PREVIOUS INTERVENTION(S): Left renal artery stent placed 10/23/2012    DUPLEX EXAM: Aorta duplex limited    LOCATION DIAMETER AP (cm) DIAMETER TRANSVERSE (cm) VELOCITIES (cm/sec)  Aorta Proximal 2.0 1.7 61  Aorta Mid 2.0 1.5 29  Aorta Distal 3.9 4.1 27  Right Common Iliac Artery 1.1 - 228  Left Common Iliac Artery 1.1 - 269    Previous max aortic diameter:  3.9 x 3.4 Date: 03/08/2012     ADDITIONAL  FINDINGS: Soft plaque noted with the aneurysm with a lumen measuring 1.8 cm AP. Right common femoral artery 127 cm dampened biphasic  waveform, left common femoral artery 132 cm biphasic waveform.    IMPRESSION: 1. 3.9 x 4.1 cm distal abdominal aortic aneurysm. 2. Iliac arteries not well visualized due to bowel gas.    Compared to the previous exam:  No significant change.   RENAL ARTERY DUPLEX EVALUATION    INDICATION: Follow up stent    PREVIOUS INTERVENTION(S): Left renal artery stent placed 10/23/2012    DUPLEX EXAM: Renal stent evaluation    AORTA Peak Systolic Velocity (cm/s): -    RIGHT  LEFT   Peak Systolic Velocities (cm/s) Comments  Peak Systolic Velocities (cm/s) Comments    Renal Artery Origin/Proximal      Renal Artery Mid      Renal Artery Distal      Accessory Renal Artery (when present)      Renal / Aortic Ratio (RAR)    Kidney Size (cm)     Renal Vein     Peak Systolic Velocities (cm/s) Comments  Renal Artery Stent (  ) Peak Systolic Velocities (cm/s) Comments    Proximal 619     Mid 66     Distal N/V     ADDITIONAL FINDINGS:     IMPRESSION: 1. Technically difficult exam. 2. 60 - 99% left renal artery stenosis, limited visualization    Compared to the previous exam:        ASSESSMENT:  Rebecca Tate is a 78 y.o. female who is s/p Left renal artery stent placed 10/23/2012 and is followed for known AAA. She is having severe intermittent epigastric pain that lasts 10-12 minutes, started about 2 months ago, not increasing in frequency or severity, denies back pain. She denies history of GI bleed, GI ulcers, or any known GI problems other than gallstones. She states her blood pressure has been good, about 130's/60's.  4.1 cm is the largest dimension of her AAA, compared to 3.9 cm one year ago. 60 - 99% left renal artery stenosis, limited visualization. Dr. Scot Dock spoke with patient.  PLAN:   Based on today's exam and non-invasive vascular lab results, the patient will follow up in 2 weeks with the following tests CT angiogram of abdomen/pelvis to evaluate AAA in regards to 2  months duration intermittent severe epigastric pain, may also evaluate 60 - 99% left renal artery stenosis,  follow up with Dr. Trula Slade. I discussed in depth with the patient the nature of atherosclerosis, and emphasized the importance of maximal medical management including strict control of blood pressure, blood glucose, and lipid levels, obtaining regular exercise, and cessation of smoking.  The patient is aware that without maximal medical management the underlying atherosclerotic disease process will progress, limiting the benefit of any interventions. The patient was given information about stroke prevention and what symptoms should prompt the patient to seek immediate medical care. The patient was given information about AAA including signs, symptoms, treatment,  what symptoms should prompt the patient to seek immediate medical care, and how to minimize the risk of enlargement and rupture of aneurysms. The patient was given information about PAD including signs, symptoms, treatment, what symptoms should prompt the patient to seek immediate medical care, and risk reduction measures to take. Thank you for allowing [place] to participate in this patient's care.  Clemon Chambers, RN, MSN, FNP-C [facility] Office: [phone]  Clinic MD: Scot Dock 03/05/2014 11:07 AM

## 2014-03-05 NOTE — Patient Instructions (Signed)
Abdominal Aortic Aneurysm An aneurysm is a weakened or damaged part of an artery wall that bulges from the normal force of blood pumping through the body. An abdominal aortic aneurysm is an aneurysm that occurs in the lower part of the aorta, the main artery of the body.  The major concern with an abdominal aortic aneurysm is that it can enlarge and burst (rupture) or blood can flow between the layers of the wall of the aorta through a tear (aorticdissection). Both of these conditions can cause bleeding inside the body and can be life threatening unless diagnosed and treated promptly. CAUSES  The exact cause of an abdominal aortic aneurysm is unknown. Some contributing factors are:   A hardening of the arteries caused by the buildup of fat and other substances in the lining of a blood vessel (arteriosclerosis).  Inflammation of the walls of an artery (arteritis).   Connective tissue diseases, such as Marfan syndrome.   Abdominal trauma.   An infection, such as syphilis or staphylococcus, in the wall of the aorta (infectious aortitis) caused by bacteria. RISK FACTORS  Risk factors that contribute to an abdominal aortic aneurysm may include:  Age older than 60 years.   High blood pressure (hypertension).  Female gender.  Ethnicity (white race).  Obesity.  Family history of aneurysm (first degree relatives only).  Tobacco use. PREVENTION  The following healthy lifestyle habits may help decrease your risk of abdominal aortic aneurysm:  Quitting smoking. Smoking can raise your blood pressure and cause arteriosclerosis.  Limiting or avoiding alcohol.  Keeping your blood pressure, blood sugar level, and cholesterol levels within normal limits.  Decreasing your salt intake. In somepeople, too much salt can raise blood pressure and increase your risk of abdominal aortic aneurysm.  Eating a diet low in saturated fats and cholesterol.  Increasing your fiber intake by including  whole grains, vegetables, and fruits in your diet. Eating these foods may help lower blood pressure.  Maintaining a healthy weight.  Staying physically active and exercising regularly. SYMPTOMS  The symptoms of abdominal aortic aneurysm may vary depending on the size and rate of growth of the aneurysm.Most grow slowly and do not have any symptoms. When symptoms do occur, they may include:  Pain (abdomen, side, lower back, or groin). The pain may vary in intensity. A sudden onset of severe pain may indicate that the aneurysm has ruptured.  Feeling full after eating only small amounts of food.  Nausea or vomiting or both.  Feeling a pulsating lump in the abdomen.  Feeling faint or passing out. DIAGNOSIS  Since most unruptured abdominal aortic aneurysms have no symptoms, they are often discovered during diagnostic exams for other conditions. An aneurysm may be found during the following procedures:  Ultrasonography (A one-time screening for abdominal aortic aneurysm by ultrasonography is also recommended for all men aged 65-75 years who have ever smoked).  X-ray exams.  A computed tomography (CT).  Magnetic resonance imaging (MRI).  Angiography or arteriography. TREATMENT  Treatment of an abdominal aortic aneurysm depends on the size of your aneurysm, your age, and risk factors for rupture. Medication to control blood pressure and pain may be used to manage aneurysms smaller than 6 cm. Regular monitoring for enlargement may be recommended by your caregiver if:  The aneurysm is 3 4 cm in size (an annual ultrasonography may be recommended).  The aneurysm is 4 4.5 cm in size (an ultrasonography every 6 months may be recommended).  The aneurysm is larger than 4.5   cm in size (your caregiver may ask that you be examined by a vascular surgeon). If your aneurysm is larger than 6 cm, surgical repair may be recommended. There are two main methods for repair of an aneurysm:   Endovascular  repair (a minimally invasive surgery). This is done most often.  Open repair. This method is used if an endovascular repair is not possible. Document Released: 08/31/2005 Document Revised: 03/18/2013 Document Reviewed: 12/21/2012 [facility] Patient Information 2014 [place], [place].  Renal Artery Stenosis Renal artery stenosis (RAS) is the narrowing of the artery that supplies blood to the kidney. If the narrowing is critical and the kidney does not get enough blood, hypertension (high blood pressure) can develop. This is called renal vascular hypertension (RVH). This is a common, uncommon cause of secondary hypertension. It does not usually happen until there is at least a 70% narrowing of the artery. Decreased blood flow through the renal artery causes the kidney to release increased amounts of a hormone. It is called renin. Renin is a strong blood pressure regulator. When it is high, it causes changes that lead to hypertension. Eventually the kidney not receiving enough blood may shrink in size and become less useful. The high blood pressure that is produced can eventually damage and destroy the remaining kidney. This is called hypertensive nephrosclerosis. If both kidneys fail, it will lead to chronic renal failure.  CAUSES  Most renal artery stenosis is caused by a hardening of the arteries (atherosclerosis). This is called Atherosclerotic Renal Artery Stenosis (AS-RAS). It is caused by a build-up of cholesterol (plaques) on the inner lining of the renal artery. A much less common cause is Fibromuscular Dysplasia (FMD). With it, there is an abnormality in the muscular lining of the renal artery. FMD-RAS occurs almost exclusively in women aged 7 to 55. It rarely affects African Americans or Asians.  SYMPTOMS  Often high blood pressure is discovered on a routine blood pressure check. It may be the only sign that something is wrong. Other problems that may occur are:  You may develop calf pain when  walking. This is called intermittent claudication. It may be a sign of bad circulation in the legs.  Inability to use certain blood pressure pills such as angiotensin-I (ACE-I) inhibitors or angiotensin receptor blockers (ARB's). These could cause sudden drops in blood pressure with worsening of kidney function.  More than three antihypertensive medications may be needed for blood pressure control.  New onset of high blood pressure if you are over 55. DIAGNOSIS  Your caregiver may find suggestions of this on exam if he finds bruits (like murmurs) on listening to your abdomen (belly) or the large arteries in your neck. Your caregiver may also suspect this there is a sudden worsening of your blood pressure when it has been well controlled and you are over age 13. Additional testing that may be done includes:  One diagnostic method used for renal artery stenosis (RAS) is to measure and compare the level of renin, (blood pressure-regulating hormone released by the kidneys), in the right to the left renal veins. If the amount of renin released by one-side is markedly higher than the other, this identifies a high renin-releasing kidney consistent with RAS.  FMD-RAS is often found on renal scan with ACE-inhibitor challenge, or ultrasound with Doppler.  FMD responds well to angioplasty and stenting. The results of stenting in FMD are usually long lasting. RISK FACTORS  Most renal artery stenosis is caused by a hardening of the arteries. This  is called atherosclerosis. Other risk factors associated with the development of atherosclerotic RAS include the following:   Carotid artery disease.  Obesity.  High blood pressure.  Heredity.  Old age.  Fibromuscular dysplasia.  Diabetes mellitus.  Smoking.  Hardening of the arteries. TREATMENT   Renal vascular hypertension can be very severe. It can also be difficult to control.  Medication is used to control high blood pressure (hypertension).  Blood pressure medications that directly affect the renin angiotensin pathway can be used toe help control blood pressure. ACE inhibitors and angiotensin receptor blockers (ARB's) are often effective in patients with unilateral RAS. In some cases, patients with RAS are resistant to these medications.  In patients with bilateral RAS, these medications must be used carefully. They may cause acute renal failure (ARF). If acute renal failure develops (if creatinine increases by more than 30%), the medication is discontinued. The patient is evaluated for bilateral RAS.  Angioplasty and stenting may be used to improve blood flow. The goal is to improve the circulation of blood flow to the kidney and prevent the release of excess renin, which can help to decrease blood pressure. This helps to prevent atrophy of the kidney. In general, patients with AS-RAS should have stenting done. This is because plasty by itself has a high incidence of re-stenosis.  Surgery to bypass the narrowing may be done. If the kidney with RAS has diminished in size or strength (atrophied ), surgical removal of the kidney may be advised. This is called nephrectomy. Document Released: 08/17/2005 Document Revised: 02/13/2012 Document Reviewed: 11/20/2008 [facility] Patient Information 2014 [place].

## 2014-03-10 ENCOUNTER — Telehealth: Payer: Self-pay

## 2014-03-10 ENCOUNTER — Inpatient Hospital Stay: Admission: RE | Admit: 2014-03-10 | Payer: Medicare Other | Source: Ambulatory Visit

## 2014-03-10 DIAGNOSIS — R1013 Epigastric pain: Secondary | ICD-10-CM

## 2014-03-10 NOTE — Telephone Encounter (Signed)
Phone call from [place] re: elev. Creatinine of 3.31.  Pt. is scheduled for CTA abd/ pelvis with contrast today and due to her elevated creatinine, the exam will have to be done without contrast.  Discussed with Dr. Trula Slade and S. Nash Mantis, NP.  Reviewed recent office notes.  Recommended to cancel the CTA Abd./ Pelvis, due to elevated creatinine, and to order a Mesenteric ultrasound in place of the CTA.  Notified [facility] of plan.  Spoke with Romie Minus, and pt. Will be notified of plan to cancel CTA and schedule mesenteric ultrasound.

## 2014-03-11 NOTE — Telephone Encounter (Signed)
Spoke with pt to adjust time to allow for mesenteric duplex, dpm

## 2014-03-14 ENCOUNTER — Encounter: Payer: Self-pay | Admitting: Surgery

## 2014-03-17 ENCOUNTER — Ambulatory Visit (HOSPITAL_COMMUNITY)
Admission: RE | Admit: 2014-03-17 | Discharge: 2014-03-17 | Disposition: A | Discharge status: Home / Self Care | Payer: Medicare Other | Source: Ambulatory Visit | Attending: Surgery | Admitting: Surgery

## 2014-03-17 ENCOUNTER — Ambulatory Visit (INDEPENDENT_AMBULATORY_CARE_PROVIDER_SITE_OTHER): Payer: Medicare Other | Admitting: Surgery

## 2014-03-17 ENCOUNTER — Encounter: Payer: Self-pay | Admitting: Surgery

## 2014-03-17 VITALS — BP 153/62 | HR 69 | Ht 63.5 in | Wt 137.0 lb

## 2014-03-17 DIAGNOSIS — R1013 Epigastric pain: Secondary | ICD-10-CM

## 2014-03-17 DIAGNOSIS — I714 Abdominal aortic aneurysm, without rupture, unspecified: Secondary | ICD-10-CM | POA: Insufficient documentation

## 2014-03-17 DIAGNOSIS — I701 Atherosclerosis of renal artery: Secondary | ICD-10-CM

## 2014-03-17 DIAGNOSIS — Z48812 Encounter for surgical aftercare following surgery on the circulatory system: Secondary | ICD-10-CM

## 2014-03-17 NOTE — Progress Notes (Signed)
Patient name: Rebecca Tate MRN: [id number] DOB: Aug 06, 1936 Sex: female     Chief Complaint  Patient presents with  . Re-evaluation    2 wk f/u CTA cx due to creatine level    HISTORY OF PRESENT ILLNESS: The patient is back today for followup.  She has a history of left renal artery stenting.  This was done in the setting of difficult to control blood pressure and renal insufficiency.  It was very difficult to place.  I attempted to attempts from the right groin and was ultimately successful from the left brachial approach.  The patient's blood pressure remains significantly improved, however she has not had much change in her creatinine.  I am also following her for a 4.1 cm abdominal aortic aneurysm.  She was seen recently in the office and was complaining of epigastric pain.  With her vascular disease, I wanted her to get a duplex of her mesenteric arteries.  The patient has not had abdominal pain for a couple of weeks.  She states that it is not related T. and that she has not lost any weight.  Past Medical History  Diagnosis Date  . Hypertension   . Hypothyroidism     thyroid nodule  . Hypercholesteremia   . Cataracts, bilateral   . Hx of adenomatous colonic polyps   . AAA (abdominal aortic aneurysm)   . Gallstones   . Glaucoma   . ANA positive   . Carotid artery occlusion   . Shortness of breath     "  at timesof exertion"  . Chronic kidney disease     left renal artery stent    Past Surgical History  Procedure Laterality Date  . Appendectomy    . Colonoscopy    . Eus  06/20/2012    Procedure: ESOPHAGEAL ENDOSCOPIC ULTRASOUND (EUS) RADIAL;  Surgeon: Arta Silence, MD;  Location: [facility];  Service: Endoscopy;  Laterality: N/A;  . Abdominal hysterectomy  1975    partial  . Renal artery stent Left     November, 2013, Dr. Trula Slade  . Colonoscopy with propofol N/A 09/03/2013    Procedure: COLONOSCOPY WITH PROPOFOL;  Surgeon: Garlan Fair, MD;  Location: [facility];  Service: Endoscopy;  Laterality: N/A;    History   Social History  . Marital Status: Single    Spouse Name: N/A    Number of Children: N/A  . Years of Education: N/A   Occupational History  . Not on file.   Social History Main Topics  . Smoking status: Former Smoker -- 0.50 packs/day    Types: Cigarettes    Quit date: 12/05/2008  . Smokeless tobacco: Never Used  . Alcohol Use: Yes     Comment: rare glass of wine  . Drug Use: No  . Sexual Activity: Not Currently   Other Topics Concern  . Not on file   Social History Narrative  . No narrative on file    Family History  Problem Relation Age of Onset  . Heart disease Mother   . Hypertension Mother   . Diabetes Mother   . Heart attack Father   . Diabetes Sister   . Hypertension Sister   . Diabetes Brother     Amputation  . Heart disease Brother     Heart Disease before age 36  . Hypertension Brother   . Heart attack Brother     Allergies as of 03/17/2014 - Review Complete 03/17/2014  Allergen Reaction Noted  .  Hydralazine Other (See Comments) 09/03/2012  . Penicillins      Current Outpatient Prescriptions on File Prior to Visit  Medication Sig Dispense Refill  . amLODipine (NORVASC) 10 MG tablet Take 10 mg by mouth every morning.       . clopidogrel (PLAVIX) 75 MG tablet TAKE ONE TABLET BY MOUTH ONCE DAILY  30 tablet  0  . dorzolamide (TRUSOPT) 2 % ophthalmic solution Place 1 drop into both eyes 2 (two) times daily.      Marland Kitchen doxercalciferol (HECTOROL) 0.5 MCG capsule Take 1 mcg by mouth daily.      Marland Kitchen levothyroxine (SYNTHROID, LEVOTHROID) 75 MCG tablet Take 75 mcg by mouth every morning.       . metoprolol succinate (TOPROL-XL) 25 MG 24 hr tablet Take 25 mg by mouth every morning.        No current facility-administered medications on file prior to visit.     REVIEW OF SYSTEMS: No change from prior visit  PHYSICAL EXAMINATION:   Vital signs are BP 153/62  Pulse 69  Ht 5' 3.5" (1.613 m)  Wt  137 lb (62.143 kg)  BMI 23.88 kg/m2  SpO2 100% General: The patient appears their stated age. HEENT:  No gross abnormalities Pulmonary:  Non labored breathing Abdomen: Soft and non-tender.  No Doppler bruit Musculoskeletal: There are no major deformities. Neurologic: No focal weakness or paresthesias are detected, Skin: There are no ulcer or rashes noted. Psychiatric: The patient has normal affect. Cardiovascular: There is a regular rate and rhythm without significant murmur appreciated.  Right carotid bruit   Diagnostic Studies Mesenteric ultrasound today shows stenosis at the origin of the celiac and superior mesenteric artery.  Her aneurysm is 4.1 cm.  The inferior mesenteric artery could not be identified due to the presence of multiple large branches off of the superior mesenteric artery  Assessment: #1: Carotid stenosis #2: Abdominal aortic aneurysm #3: Abdominal pain #4: Renal stenosis Plan: #1: I hear a right carotid bruit on exam today.  Her most recent ultrasound showed common carotid stenosis.  I will repeat this study in 6 months. #2: Abdominal aortic aneurysm remained stable at 4.1 cm.  She will come back in one year for an ultrasound. #3: I do not feel her abdominal pain is related to mesenteric stenosis despite her ultrasound findings.  Her symptoms are not related to food intake and are very intermittent. #4: The patient does have elevated velocities on ultrasound.  This was a difficult exam.  She has not had significant issues with her blood pressure, and her creatinine has remained stable.  Therefore, I have elected to repeat this exam in 6 months.  If the velocity profile remains elevated, I will consider a repeat intervention  V. Leia Alf, M.D. [facility] [place] Office: [phone] Pager:  [phone]

## 2014-03-17 NOTE — Addendum Note (Signed)
Addended by: Dorthula Rue L on: 03/17/2014 12:43 PM   Modules accepted: Orders

## 2014-03-31 ENCOUNTER — Other Ambulatory Visit: Payer: Self-pay | Admitting: Surgery

## 2014-04-16 ENCOUNTER — Other Ambulatory Visit: Payer: Self-pay | Admitting: *Deleted

## 2014-04-16 DIAGNOSIS — Z01810 Encounter for preprocedural cardiovascular examination: Secondary | ICD-10-CM

## 2014-04-16 DIAGNOSIS — N186 End stage renal disease: Secondary | ICD-10-CM

## 2014-04-29 ENCOUNTER — Other Ambulatory Visit: Payer: Self-pay | Admitting: Surgery

## 2014-05-09 ENCOUNTER — Encounter: Payer: Self-pay | Admitting: Surgery

## 2014-05-12 ENCOUNTER — Ambulatory Visit (HOSPITAL_COMMUNITY)
Admission: RE | Admit: 2014-05-12 | Discharge: 2014-05-12 | Disposition: A | Discharge status: Home / Self Care | Payer: Medicare Other | Source: Ambulatory Visit | Attending: Surgery | Admitting: Surgery

## 2014-05-12 ENCOUNTER — Encounter: Payer: Self-pay | Admitting: Surgery

## 2014-05-12 ENCOUNTER — Ambulatory Visit (INDEPENDENT_AMBULATORY_CARE_PROVIDER_SITE_OTHER): Payer: Medicare Other | Admitting: Surgery

## 2014-05-12 ENCOUNTER — Ambulatory Visit (INDEPENDENT_AMBULATORY_CARE_PROVIDER_SITE_OTHER)
Admission: RE | Admit: 2014-05-12 | Discharge: 2014-05-12 | Disposition: A | Discharge status: Home / Self Care | Payer: Medicare Other | Source: Ambulatory Visit | Attending: Surgery | Admitting: Surgery

## 2014-05-12 VITALS — BP 155/65 | HR 70 | Ht 63.5 in | Wt 136.0 lb

## 2014-05-12 DIAGNOSIS — N186 End stage renal disease: Secondary | ICD-10-CM

## 2014-05-12 DIAGNOSIS — Z01810 Encounter for preprocedural cardiovascular examination: Secondary | ICD-10-CM

## 2014-05-12 NOTE — Progress Notes (Signed)
[facility] [place] HISTORY AND PHYSICAL   History of Present Illness:  Patient is a 78 y.o. year old female who presents for placement of a permanent hemodialysis access. The patient is right handed .  The patient is not currently on hemodialysis.  The cause of renal failure is thought to be secondary to hypertension.  Other chronic medical problems include renal artery stent and is now on Plavix,  Hypercholesterolemia not treated with a statin and  Hypertension treated with Toprol.  She denise diabetes.  The patient was seen by me in April of 2015, following a CT angiogram.  The following was my assessment and plan:  Assessment:  #1: Carotid stenosis  #2: Abdominal aortic aneurysm  #3: Abdominal pain  #4: Renal stenosis  Plan:  #1: I hear a right carotid bruit on exam today. Her most recent ultrasound showed common carotid stenosis. I will repeat this study in 6 months.  #2: Abdominal aortic aneurysm remained stable at 4.1 cm. She will come back in one year for an ultrasound.  #3: I do not feel her abdominal pain is related to mesenteric stenosis despite her ultrasound findings. Her symptoms are not related to food intake and are very intermittent.  #4: The patient does have elevated velocities on ultrasound. This was a difficult exam. She has not had significant issues with her blood pressure, and her creatinine has remained stable. Therefore, I have elected to repeat this exam in 6 months. If the velocity profile remains elevated, I will consider a repeat intervention   Past Medical History  Diagnosis Date  . Hypertension   . Hypothyroidism     thyroid nodule  . Hypercholesteremia   . Cataracts, bilateral   . Hx of adenomatous colonic polyps   . AAA (abdominal aortic aneurysm)   . Gallstones   . Glaucoma   . ANA positive   . Carotid artery occlusion   . Shortness of breath     "  at timesof exertion"  . Chronic kidney disease     left renal artery stent     Past Surgical History  Procedure Laterality Date  . Appendectomy    . Colonoscopy    . Eus  06/20/2012    Procedure: ESOPHAGEAL ENDOSCOPIC ULTRASOUND (EUS) RADIAL;  Surgeon: Arta Silence, MD;  Location: [facility];  Service: Endoscopy;  Laterality: N/A;  . Abdominal hysterectomy  1975    partial  . Renal artery stent Left     November, 2013, Dr. Trula Slade  . Colonoscopy with propofol N/A 09/03/2013    Procedure: COLONOSCOPY WITH PROPOFOL;  Surgeon: Garlan Fair, MD;  Location: [facility];  Service: Endoscopy;  Laterality: N/A;     Social History History  Substance Use Topics  . Smoking status: Former Smoker -- 0.50 packs/day    Types: Cigarettes    Quit date: 12/05/2008  . Smokeless tobacco: Never Used  . Alcohol Use: Yes     Comment: rare glass of wine    Family History Family History  Problem Relation Age of Onset  . Heart disease Mother   . Hypertension Mother   . Diabetes Mother   . Heart attack Father   . Diabetes Sister   . Hypertension Sister   . Diabetes Brother     Amputation  . Heart disease Brother     Heart Disease before age 32  . Hypertension Brother   . Heart attack Brother   . Peripheral vascular disease Brother  amputation    Allergies  Allergies  Allergen Reactions  . Hydralazine Other (See Comments)    Liver inflamed  . Penicillins     REACTION: hives/itch     Current Outpatient Prescriptions  Medication Sig Dispense Refill  . amLODipine (NORVASC) 10 MG tablet Take 10 mg by mouth every morning.       . clopidogrel (PLAVIX) 75 MG tablet TAKE ONE TABLET BY MOUTH ONCE DAILY  30 tablet  0  . dorzolamide (TRUSOPT) 2 % ophthalmic solution Place 1 drop into both eyes 2 (two) times daily.      Marland Kitchen doxercalciferol (HECTOROL) 0.5 MCG capsule Take 1 mcg by mouth daily.      Marland Kitchen levothyroxine (SYNTHROID, LEVOTHROID) 75 MCG tablet Take 75 mcg by mouth every morning.       . metoprolol succinate (TOPROL-XL) 25 MG 24 hr tablet Take 25 mg  by mouth every morning.        No current facility-administered medications for this visit.    ROS:   General:  No weight loss, Fever, chills  HEENT: No recent headaches, no nasal bleeding, no visual changes, no sore throat  Neurologic: No dizziness, blackouts, seizures. No recent symptoms of stroke or mini- stroke. No recent episodes of slurred speech, or temporary blindness.  Cardiac: No recent episodes of chest pain/pressure, no shortness of breath at rest.  No shortness of breath with exertion.  Denies history of atrial fibrillation or irregular heartbeat  Vascular: No history of rest pain in feet.  No history of claudication.  No history of non-healing ulcer, No history of DVT   Pulmonary: No home oxygen, no productive cough, no hemoptysis,  No asthma or wheezing  Musculoskeletal:  [ ]  Arthritis, [ ]  Low back pain,  [ ]  Joint pain  Hematologic:No history of hypercoagulable state.  No history of easy bleeding.  No history of anemia  Gastrointestinal: No hematochezia or melena,  No gastroesophageal reflux, no trouble swallowing  Urinary: [x ] chronic Kidney disease, [ ]  on HD - [ ]  MWF or [ ]  TTHS, [ ]  Burning with urination, [ ]  Frequent urination, [ ]  Difficulty urinating;   Skin: No rashes  Psychological: No history of anxiety,  No history of depression   Physical Examination  Filed Vitals:   05/12/14 1508  BP: 155/65  Pulse: 70  Height: 5' 3.5" (1.613 m)  Weight: 136 lb (61.689 kg)  SpO2: 100%    Body mass index is 23.71 kg/(m^2).  General:  Alert and oriented, no acute distress HEENT: Normal Neck: No bruit  Pulmonary: Clear to auscultation bilaterally Cardiac: Regular Rate and Rhythm without murmur Gastrointestinal: Soft, non-tender, non-distended, no mass, no scars Skin: No rash Extremity Pulses:  2+ radial, brachial pulses bilaterally Musculoskeletal: No deformity or edema  Neurologic: Upper and lower extremity motor 5/5 and symmetric  DATA:  Vein  mapping shows acceptable right Cephalic vein above the antecubital.  Arterial duplex shows Normal wave forms on the right and Known left subclavian stenosis.  ASSESSMENT:  Chronic kidney disease Secondary to difficult hypertension control    PLAN: AV fistula creation right Brachiocephalic because she has subclavian stenosis on the left.  Dr. Trula Slade discussed the surgery and complications today.    Ulyses Amor PA-C [facility] [place] Office: [phone]  The patient is here today for evaluation of dialysis access.  I have reviewed her ultrasound studies.  She is not a candidate for left-sided access given her subclavian stenosis.  Her cephalic vein appears to be adequate on the right arm.  She has palpable right brachial pulse.  I discussed proceeding with right brachiocephalic fistula.  The risks and benefits were discussed with the patient including the risk of non-maturity of the need for future interventions.  The patient would like to further discuss this with her nephrologist.  She will contact me for a date of surgery.  I would like her off Plavix for 5 days before operation which would be a right upper arm fistula.  [name]

## 2014-06-02 ENCOUNTER — Other Ambulatory Visit: Payer: Self-pay | Admitting: Surgery

## 2014-06-23 ENCOUNTER — Other Ambulatory Visit: Payer: Medicare Other

## 2014-06-23 ENCOUNTER — Ambulatory Visit: Payer: Medicare Other

## 2014-07-01 ENCOUNTER — Other Ambulatory Visit: Payer: Self-pay | Admitting: Surgery

## 2014-07-14 ENCOUNTER — Other Ambulatory Visit (HOSPITAL_COMMUNITY): Payer: Medicare Other

## 2014-07-14 ENCOUNTER — Ambulatory Visit: Payer: Medicare Other | Admitting: Family

## 2014-09-19 ENCOUNTER — Encounter: Payer: Self-pay | Admitting: Surgery

## 2014-09-22 ENCOUNTER — Encounter: Payer: Self-pay | Admitting: Surgery

## 2014-09-22 ENCOUNTER — Other Ambulatory Visit: Payer: Self-pay | Admitting: Surgery

## 2014-09-22 ENCOUNTER — Ambulatory Visit (HOSPITAL_COMMUNITY)
Admission: RE | Admit: 2014-09-22 | Discharge: 2014-09-22 | Disposition: A | Discharge status: Home / Self Care | Payer: Medicare Other | Source: Ambulatory Visit | Attending: Surgery | Admitting: Surgery

## 2014-09-22 ENCOUNTER — Ambulatory Visit (INDEPENDENT_AMBULATORY_CARE_PROVIDER_SITE_OTHER)
Admission: RE | Admit: 2014-09-22 | Discharge: 2014-09-22 | Disposition: A | Discharge status: Home / Self Care | Payer: Medicare Other | Source: Ambulatory Visit | Attending: Surgery | Admitting: Surgery

## 2014-09-22 ENCOUNTER — Ambulatory Visit (INDEPENDENT_AMBULATORY_CARE_PROVIDER_SITE_OTHER): Payer: Medicare Other | Admitting: Surgery

## 2014-09-22 VITALS — BP 155/49 | HR 62 | Ht 63.5 in | Wt 136.8 lb

## 2014-09-22 DIAGNOSIS — I708 Atherosclerosis of other arteries: Secondary | ICD-10-CM

## 2014-09-22 DIAGNOSIS — I701 Atherosclerosis of renal artery: Secondary | ICD-10-CM

## 2014-09-22 DIAGNOSIS — Z48812 Encounter for surgical aftercare following surgery on the circulatory system: Secondary | ICD-10-CM | POA: Diagnosis not present

## 2014-09-22 DIAGNOSIS — I6523 Occlusion and stenosis of bilateral carotid arteries: Secondary | ICD-10-CM

## 2014-09-22 DIAGNOSIS — R0989 Other specified symptoms and signs involving the circulatory and respiratory systems: Secondary | ICD-10-CM

## 2014-09-22 DIAGNOSIS — I714 Abdominal aortic aneurysm, without rupture, unspecified: Secondary | ICD-10-CM

## 2014-09-22 DIAGNOSIS — I771 Stricture of artery: Secondary | ICD-10-CM

## 2014-09-22 DIAGNOSIS — I6529 Occlusion and stenosis of unspecified carotid artery: Secondary | ICD-10-CM | POA: Insufficient documentation

## 2014-09-22 NOTE — Addendum Note (Signed)
Addended by: Mena Goes on: 09/22/2014 01:28 PM   Modules accepted: Orders

## 2014-09-22 NOTE — Progress Notes (Signed)
Patient name: Rebecca Tate MRN: [id number] DOB: May 06, 1936 Sex: female     Chief Complaint  Patient presents with  . Re-evaluation    6 month f/u     HISTORY OF PRESENT ILLNESS: The patient is back for followup.  At her last visit, she was referred for worsening renal function secondary to hypertension.  She was scheduled for a right upper arm graft but did not follow through.  I am also following her for an abdominal aortic aneurysm, a history of left renal artery stenting, and a new carotid bruit.  The patient has not reported any interval change in her medical history since I last saw her.  Past Medical History  Diagnosis Date  . Hypertension   . Hypothyroidism     thyroid nodule  . Hypercholesteremia   . Cataracts, bilateral   . Hx of adenomatous colonic polyps   . AAA (abdominal aortic aneurysm)   . Gallstones   . Glaucoma   . ANA positive   . Carotid artery occlusion   . Shortness of breath     "  at timesof exertion"  . Chronic kidney disease     left renal artery stent    Past Surgical History  Procedure Laterality Date  . Appendectomy    . Colonoscopy    . Eus  06/20/2012    Procedure: ESOPHAGEAL ENDOSCOPIC ULTRASOUND (EUS) RADIAL;  Surgeon: Arta Silence, MD;  Location: [facility];  Service: Endoscopy;  Laterality: N/A;  . Abdominal hysterectomy  1975    partial  . Renal artery stent Left     November, 2013, Dr. Trula Slade  . Colonoscopy with propofol N/A 09/03/2013    Procedure: COLONOSCOPY WITH PROPOFOL;  Surgeon: Garlan Fair, MD;  Location: [facility];  Service: Endoscopy;  Laterality: N/A;    History   Social History  . Marital Status: Single    Spouse Name: N/A    Number of Children: N/A  . Years of Education: N/A   Occupational History  . Not on file.   Social History Main Topics  . Smoking status: Former Smoker -- 0.50 packs/day    Types: Cigarettes    Quit date: 12/05/2008  . Smokeless tobacco: Never Used  . Alcohol Use: Yes      Comment: rare glass of wine  . Drug Use: No  . Sexual Activity: Not Currently   Other Topics Concern  . Not on file   Social History Narrative  . No narrative on file    Family History  Problem Relation Age of Onset  . Heart disease Mother   . Hypertension Mother   . Diabetes Mother   . Heart attack Father   . Diabetes Sister   . Hypertension Sister   . Diabetes Brother     Amputation  . Heart disease Brother     Heart Disease before age 51  . Hypertension Brother   . Heart attack Brother   . Peripheral vascular disease Brother     amputation    Allergies as of 09/22/2014 - Review Complete 09/22/2014  Allergen Reaction Noted  . Hydralazine Other (See Comments) 09/03/2012  . Penicillins      Current Outpatient Prescriptions on File Prior to Visit  Medication Sig Dispense Refill  . amLODipine (NORVASC) 10 MG tablet Take 10 mg by mouth every morning.       . clopidogrel (PLAVIX) 75 MG tablet TAKE ONE TABLET BY MOUTH ONCE DAILY  30 tablet  11  .  dorzolamide (TRUSOPT) 2 % ophthalmic solution Place 1 drop into both eyes 2 (two) times daily.      Marland Kitchen doxercalciferol (HECTOROL) 0.5 MCG capsule Take 1 mcg by mouth daily.      Marland Kitchen levothyroxine (SYNTHROID, LEVOTHROID) 75 MCG tablet Take 75 mcg by mouth every morning.       . metoprolol succinate (TOPROL-XL) 25 MG 24 hr tablet Take 25 mg by mouth every morning.        No current facility-administered medications on file prior to visit.     REVIEW OF SYSTEMS: EC history of present illness, otherwise no change  PHYSICAL EXAMINATION:   Vital signs are BP 155/49  Pulse 62  Ht 5' 3.5" (1.613 m)  Wt 136 lb 12.8 oz (62.052 kg)  BMI 23.85 kg/m2  SpO2 100% General: The patient appears their stated age. HEENT:  No gross abnormalities Pulmonary:  Non labored breathing Abdomen: Soft and non-tender no abdominal bruit Musculoskeletal: There are no major deformities. Neurologic: No focal weakness or paresthesias are  detected, Skin: There are no ulcer or rashes noted. Psychiatric: The patient has normal affect. Cardiovascular: There is a regular rate and rhythm without significant murmur.  Left carotid artery appreciated.   Diagnostic Studies Carotid duplex: 40-59% left carotid stenosis, less than 40% right carotid stenosis.  Greater than 50% distal left common carotid Less than 60% stenosis within the left renal artery stent  Assessment: #1: Abdominal aortic aneurysm #2: Carotid stenosis #3: Renal failure, stage IV #4: History of left renal stent Plan: #1: The patient is scheduled for followup of her aneurysm in 6 months #2: The patient have a repeat carotid Doppler study in one year #3: I have encouraged the patient to proceed with a right brachiocephalic fistula.  She is going to talk to her nephrologist and contact me later. #4: Renal artery stent was widely patent today.  She will have a repeat ultrasound in one year  V. Leia Alf, M.D. [facility] [place] Office: [phone] Pager:  [phone]

## 2014-09-29 ENCOUNTER — Other Ambulatory Visit: Payer: Self-pay

## 2014-09-29 DIAGNOSIS — Z1231 Encounter for screening mammogram for malignant neoplasm of breast: Secondary | ICD-10-CM

## 2014-10-22 ENCOUNTER — Ambulatory Visit
Admission: RE | Admit: 2014-10-22 | Discharge: 2014-10-22 | Disposition: A | Discharge status: Home / Self Care | Payer: Medicare Other | Source: Ambulatory Visit

## 2014-10-22 DIAGNOSIS — Z1231 Encounter for screening mammogram for malignant neoplasm of breast: Secondary | ICD-10-CM

## 2014-11-13 ENCOUNTER — Encounter (HOSPITAL_COMMUNITY): Payer: Self-pay | Admitting: Surgery

## 2014-12-11 ENCOUNTER — Telehealth: Payer: Self-pay

## 2014-12-11 NOTE — Telephone Encounter (Signed)
Phone call to pt. to inquire if she had talked with her Nephrologist, and if she was ready to schedule the Right Arm AVF.  Pt. Stated she had a lot going on right now.  Reported she is scheduled for eye surgery 12/15/14.  Stated "I just don't know what to do; I know my kidneys aren't getting any better, but they aren't any worse, either."  Stated that Dr. Marval Regal wants her to proceed with surgery for the [employer].  Stated she understands that she could get in to an emergency situation, and would have to have a catheter in her neck. Stated she will call back to schedule after she recovers from her upcoming eye surgery.

## 2014-12-18 ENCOUNTER — Encounter (HOSPITAL_COMMUNITY): Payer: Self-pay | Admitting: Vascular Surgery

## 2015-02-02 ENCOUNTER — Telehealth: Payer: Self-pay

## 2015-02-02 NOTE — Telephone Encounter (Signed)
Phone call from pt.  Reported she has had increased episodes of abdominal pain.  Reported she has been keeping a journal, and has recorded 5 episodes of mid-abdominal pain,in the past month. Described the pain as "excruciating".  Stated yesterday was "the worst one."  Stated the episodes last about 20 minutes, and she can hardly move.  Stated she had an episode of left-sided back pain last week for several hrs., that radiated from her shoulder to her buttocks.  Questioned about any nausea.  Reported she has nausea intermittently, in the mornings, before she gets out of bed.  Denied any nausea associated with the episodes of abdominal pain.  Informed pt. will call her back with an appt. to come to office for evaluation. Advised to go to the ER if the episodes of abdominal pain reoccur prior to her new appt.  Verb. Understanding.

## 2015-02-03 ENCOUNTER — Encounter: Payer: Self-pay | Admitting: Family

## 2015-02-03 NOTE — Telephone Encounter (Signed)
Spoke with patient to schedule appt, dpm °

## 2015-02-04 ENCOUNTER — Encounter: Payer: Self-pay | Admitting: Family

## 2015-02-04 ENCOUNTER — Ambulatory Visit (INDEPENDENT_AMBULATORY_CARE_PROVIDER_SITE_OTHER): Payer: Medicare Other | Admitting: Family

## 2015-02-04 ENCOUNTER — Other Ambulatory Visit: Payer: Self-pay | Admitting: Surgery

## 2015-02-04 ENCOUNTER — Ambulatory Visit (HOSPITAL_COMMUNITY)
Admission: RE | Admit: 2015-02-04 | Discharge: 2015-02-04 | Disposition: A | Discharge status: Home / Self Care | Payer: Medicare Other | Source: Ambulatory Visit | Attending: Family | Admitting: Family

## 2015-02-04 VITALS — BP 151/79 | HR 71 | Resp 16 | Ht 64.0 in | Wt 133.0 lb

## 2015-02-04 DIAGNOSIS — I714 Abdominal aortic aneurysm, without rupture, unspecified: Secondary | ICD-10-CM

## 2015-02-04 DIAGNOSIS — I701 Atherosclerosis of renal artery: Secondary | ICD-10-CM

## 2015-02-04 DIAGNOSIS — Z48812 Encounter for surgical aftercare following surgery on the circulatory system: Secondary | ICD-10-CM | POA: Insufficient documentation

## 2015-02-04 DIAGNOSIS — N184 Chronic kidney disease, stage 4 (severe): Secondary | ICD-10-CM

## 2015-02-04 DIAGNOSIS — I6523 Occlusion and stenosis of bilateral carotid arteries: Secondary | ICD-10-CM

## 2015-02-04 DIAGNOSIS — R1084 Generalized abdominal pain: Secondary | ICD-10-CM | POA: Insufficient documentation

## 2015-02-04 DIAGNOSIS — Z87891 Personal history of nicotine dependence: Secondary | ICD-10-CM

## 2015-02-04 NOTE — Patient Instructions (Signed)
Abdominal Aortic Aneurysm An aneurysm is a weakened or damaged part of an artery wall that bulges from the normal force of blood pumping through the body. An abdominal aortic aneurysm is an aneurysm that occurs in the lower part of the aorta, the main artery of the body.  The major concern with an abdominal aortic aneurysm is that it can enlarge and burst (rupture) or blood can flow between the layers of the wall of the aorta through a tear (aorticdissection). Both of these conditions can cause bleeding inside the body and can be life threatening unless diagnosed and treated promptly. CAUSES  The exact cause of an abdominal aortic aneurysm is unknown. Some contributing factors are:   A hardening of the arteries caused by the buildup of fat and other substances in the lining of a blood vessel (arteriosclerosis).  Inflammation of the walls of an artery (arteritis).   Connective tissue diseases, such as Marfan syndrome.   Abdominal trauma.   An infection, such as syphilis or staphylococcus, in the wall of the aorta (infectious aortitis) caused by bacteria. RISK FACTORS  Risk factors that contribute to an abdominal aortic aneurysm may include:  Age older than 60 years.   High blood pressure (hypertension).  Female gender.  Ethnicity (white race).  Obesity.  Family history of aneurysm (first degree relatives only).  Tobacco use. PREVENTION  The following healthy lifestyle habits may help decrease your risk of abdominal aortic aneurysm:  Quitting smoking. Smoking can raise your blood pressure and cause arteriosclerosis.  Limiting or avoiding alcohol.  Keeping your blood pressure, blood sugar level, and cholesterol levels within normal limits.  Decreasing your salt intake. In somepeople, too much salt can raise blood pressure and increase your risk of abdominal aortic aneurysm.  Eating a diet low in saturated fats and cholesterol.  Increasing your fiber intake by including  whole grains, vegetables, and fruits in your diet. Eating these foods may help lower blood pressure.  Maintaining a healthy weight.  Staying physically active and exercising regularly. SYMPTOMS  The symptoms of abdominal aortic aneurysm may vary depending on the size and rate of growth of the aneurysm.Most grow slowly and do not have any symptoms. When symptoms do occur, they may include:  Pain (abdomen, side, lower back, or groin). The pain may vary in intensity. A sudden onset of severe pain may indicate that the aneurysm has ruptured.  Feeling full after eating only small amounts of food.  Nausea or vomiting or both.  Feeling a pulsating lump in the abdomen.  Feeling faint or passing out. DIAGNOSIS  Since most unruptured abdominal aortic aneurysms have no symptoms, they are often discovered during diagnostic exams for other conditions. An aneurysm may be found during the following procedures:  Ultrasonography (A one-time screening for abdominal aortic aneurysm by ultrasonography is also recommended for all men aged 65-75 years who have ever smoked).  X-ray exams.  A computed tomography (CT).  Magnetic resonance imaging (MRI).  Angiography or arteriography. TREATMENT  Treatment of an abdominal aortic aneurysm depends on the size of your aneurysm, your age, and risk factors for rupture. Medication to control blood pressure and pain may be used to manage aneurysms smaller than 6 cm. Regular monitoring for enlargement may be recommended by your caregiver if:  The aneurysm is 3-4 cm in size (an annual ultrasonography may be recommended).  The aneurysm is 4-4.5 cm in size (an ultrasonography every 6 months may be recommended).  The aneurysm is larger than 4.5 cm in   size (your caregiver may ask that you be examined by a vascular surgeon). If your aneurysm is larger than 6 cm, surgical repair may be recommended. There are two main methods for repair of an aneurysm:   Endovascular  repair (a minimally invasive surgery). This is done most often.  Open repair. This method is used if an endovascular repair is not possible. Document Released: 08/31/2005 Document Revised: 03/18/2013 Document Reviewed: 12/21/2012 ExitCare Patient Information 2015 [employer]. This information is not intended to replace advice given to you by your health care provider. Make sure you discuss any questions you have with your health care provider.   Stroke Prevention Some medical conditions and behaviors are associated with an increased chance of having a stroke. You may prevent a stroke by making healthy choices and managing medical conditions. HOW CAN I REDUCE MY RISK OF HAVING A STROKE?   Stay physically active. Get at least 30 minutes of activity on most or all days.  Do not smoke. It may also be helpful to avoid exposure to secondhand smoke.  Limit alcohol use. Moderate alcohol use is considered to be:  No more than 2 drinks per day for men.  No more than 1 drink per day for nonpregnant women.  Eat healthy foods. This involves:  Eating 5 or more servings of fruits and vegetables a day.  Making dietary changes that address high blood pressure (hypertension), high cholesterol, diabetes, or obesity.  Manage your cholesterol levels.  Making food choices that are high in fiber and low in saturated fat, trans fat, and cholesterol may control cholesterol levels.  Take any prescribed medicines to control cholesterol as directed by your health care provider.  Manage your diabetes.  Controlling your carbohydrate and sugar intake is recommended to manage diabetes.  Take any prescribed medicines to control diabetes as directed by your health care provider.  Control your hypertension.  Making food choices that are low in salt (sodium), saturated fat, trans fat, and cholesterol is recommended to manage hypertension.  Take any prescribed medicines to control hypertension as directed  by your health care provider.  Maintain a healthy weight.  Reducing calorie intake and making food choices that are low in sodium, saturated fat, trans fat, and cholesterol are recommended to manage weight.  Stop drug abuse.  Avoid taking birth control pills.  Talk to your health care provider about the risks of taking birth control pills if you are over 35 years old, smoke, get migraines, or have ever had a blood clot.  Get evaluated for sleep disorders (sleep apnea).  Talk to your health care provider about getting a sleep evaluation if you snore a lot or have excessive sleepiness.  Take medicines only as directed by your health care provider.  For some people, aspirin or blood thinners (anticoagulants) are helpful in reducing the risk of forming abnormal blood clots that can lead to stroke. If you have the irregular heart rhythm of atrial fibrillation, you should be on a blood thinner unless there is a good reason you cannot take them.  Understand all your medicine instructions.  Make sure that other conditions (such as anemia or atherosclerosis) are addressed. SEEK IMMEDIATE MEDICAL CARE IF:   You have sudden weakness or numbness of the face, arm, or leg, especially on one side of the body.  Your face or eyelid droops to one side.  You have sudden confusion.  You have trouble speaking (aphasia) or understanding.  You have sudden trouble seeing in one or   both eyes.  You have sudden trouble walking.  You have dizziness.  You have a loss of balance or coordination.  You have a sudden, severe headache with no known cause.  You have new chest pain or an irregular heartbeat. Any of these symptoms may represent a serious problem that is an emergency. Do not wait to see if the symptoms will go away. Get medical help at once. Call your local emergency services (911 in [place] Do not drive yourself to the hospital. Document Released: 12/29/2004 Document Revised: 04/07/2014  Document Reviewed: 05/24/2013 ExitCare Patient Information 2015 [employer]. This information is not intended to replace advice given to you by your health care provider. Make sure you discuss any questions you have with your health care provider.  

## 2015-02-04 NOTE — Progress Notes (Signed)
[facility]  HISTORY AND PHYSICAL   MRN : [id number]  History of Present Illness:   Rebecca Tate is a 79 y.o. female patient of Dr. Trula Slade who returns today with c/o 7 episodes in February 2016 of sharp abdominal pain at umbilicus that is not associated with eating, it has occurred when she has not eaten for several hours. Pt denies aggravating or alleviating factors. Pain is very severe for 5-7 minutes, fades and resolves after about 20 minutes, does not radiate, located at umbilicus. She denies having a GI work up for this. Pt states this is probably the same type of pain that she was evaluated for in April 2015 when I saw her. I saw her in April 2015 with c/o 2 months duration intermittent severe epigastric pain and requested that she follow up in 2 weeks with Dr. Trula Slade and CT angiogram of abdomen/pelvis to evaluate AAA and also evaluate 60 - 99% left renal artery stenosis. CTA was not done at that time, possibly due to stage 4 CKD.  She followed up with Dr. Trula Slade later in April 2015 and in October 2015 with Duplex of renal arteries and carotid Duplex. A left renal artery stent was placed in November, 2013 in the setting of difficult to control blood pressure and renal insufficiency. It was difficult to place. Right groin access was attempted on 2 separate occasions, and ultimately was placed from the left brachial approach.  She denies history of GI bleed, GI ulcers, or any known GI problems other than gallstones.  Patient is also monitored for carotid artery stenosis and a AAA.  She denies any known back problems, denies claudication symptoms with walking but does state her legs are a little weak but do not give out when walking. Pt denis any hx of stroke or TIA.   Pt Diabetic: No Pt smoker: former smoker, quit in 2011  Pt meds include: Statin: No, taking Zetia Betablocker: Yes  ASA: No Other anticoagulants/antiplatelets: Plavix  Current  Outpatient Prescriptions  Medication Sig Dispense Refill  . amLODipine (NORVASC) 10 MG tablet Take 10 mg by mouth every morning.     . clopidogrel (PLAVIX) 75 MG tablet TAKE ONE TABLET BY MOUTH ONCE DAILY 30 tablet 11  . dorzolamide (TRUSOPT) 2 % ophthalmic solution Place 1 drop into both eyes 2 (two) times daily.    Marland Kitchen doxercalciferol (HECTOROL) 0.5 MCG capsule Take 1 mcg by mouth daily.    Marland Kitchen ezetimibe (ZETIA) 10 MG tablet Take 10 mg by mouth daily.    Marland Kitchen levothyroxine (SYNTHROID, LEVOTHROID) 75 MCG tablet Take 75 mcg by mouth every morning.     . metoprolol succinate (TOPROL-XL) 25 MG 24 hr tablet Take 25 mg by mouth every morning.      No current facility-administered medications for this visit.    Past Medical History  Diagnosis Date  . Hypertension   . Hypothyroidism     thyroid nodule  . Hypercholesteremia   . Cataracts, bilateral   . Hx of adenomatous colonic polyps   . AAA (abdominal aortic aneurysm)   . Gallstones   . Glaucoma   . ANA positive   . Carotid artery occlusion   . Shortness of breath     "  at timesof exertion"  . Chronic kidney disease     left renal artery stent    Social History History  Substance Use Topics  . Smoking status: Former Smoker -- 0.50 packs/day    Types: Cigarettes  Quit date: 12/05/2008  . Smokeless tobacco: Never Used  . Alcohol Use: Yes     Comment: rare glass of wine    Family History Family History  Problem Relation Age of Onset  . Heart disease Mother   . Hypertension Mother   . Diabetes Mother   . Heart attack Father   . Diabetes Sister   . Hypertension Sister   . Diabetes Brother     Amputation  . Heart disease Brother     Heart Disease before age 58  . Hypertension Brother   . Heart attack Brother   . Peripheral vascular disease Brother     amputation    Surgical History Past Surgical History  Procedure Laterality Date  . Appendectomy    . Colonoscopy    . Eus  06/20/2012    Procedure: ESOPHAGEAL  ENDOSCOPIC ULTRASOUND (EUS) RADIAL;  Surgeon: Arta Silence, MD;  Location: [facility];  Service: Endoscopy;  Laterality: N/A;  . Abdominal hysterectomy  1975    partial  . Renal artery stent Left     November, 2013, Dr. Trula Slade  . Colonoscopy with propofol N/A 09/03/2013    Procedure: COLONOSCOPY WITH PROPOFOL;  Surgeon: Garlan Fair, MD;  Location: [facility];  Service: Endoscopy;  Laterality: N/A;  . Renal angiogram N/A 09/18/2012    Procedure: RENAL ANGIOGRAM;  Surgeon: Serafina Mitchell, MD;  Location[facility] C[facility] LAB;  Service: Cardiovascular;  Laterality: N/A;  . Renal angiogram N/A 10/17/2012    Procedure: RENAL ANGIOGRAM;  Surgeon: Serafina Mitchell, MD;  Location: [facility] CATH LAB;  Service: Cardiovascular;  Laterality: N/A;  . Renal angiogram N/A 10/23/2012    Procedure: RENAL ANGIOGRAM;  Surgeon: Serafina Mitchell, MD;  Location: [facility] CATH LAB;  Service: Cardiovascular;  Laterality: N/A;  . Percutaneous stent intervention  10/23/2012    Procedure: PERCUTANEOUS STENT INTERVENTION;  Surgeon: Serafina Mitchell, MD;  Location: [facility] CATH LAB;  Service: Cardiovascular;;    Allergies  Allergen Reactions  . Hydralazine Other (See Comments)    Liver inflamed  . Penicillins     REACTION: hives/itch    Current Outpatient Prescriptions  Medication Sig Dispense Refill  . amLODipine (NORVASC) 10 MG tablet Take 10 mg by mouth every morning.     . clopidogrel (PLAVIX) 75 MG tablet TAKE ONE TABLET BY MOUTH ONCE DAILY 30 tablet 11  . dorzolamide (TRUSOPT) 2 % ophthalmic solution Place 1 drop into both eyes 2 (two) times daily.    Marland Kitchen doxercalciferol (HECTOROL) 0.5 MCG capsule Take 1 mcg by mouth daily.    Marland Kitchen ezetimibe (ZETIA) 10 MG tablet Take 10 mg by mouth daily.    Marland Kitchen levothyroxine (SYNTHROID, LEVOTHROID) 75 MCG tablet Take 75 mcg by mouth every morning.     . metoprolol succinate (TOPROL-XL) 25 MG 24 hr tablet Take 25 mg by mouth every morning.      No current facility-administered medications for  this visit.     REVIEW OF SYSTEMS: See HPI for pertinent positives and negatives.  Physical Examination  Filed Vitals:   02/04/15 1036  BP: 151/79  Pulse: 71  Resp: 16  Height: 5\' 4"  (1.626 m)  Weight: 133 lb (60.328 kg)  SpO2: 100%   Body mass index is 22.82 kg/(m^2).   General: WDWN in NAD Gait: Normal HENT: WNL Eyes: Pupils equal Pulmonary: normal non-labored breathing , without Rales, rhonchi, wheezing Cardiac: RRR, no murmurs detected  Abdomen: soft, NT, no masses Skin: no rashes, no ulcers; no Gangrene ,  no cellulitis; no open wounds.   VASCULAR EXAM  Carotid Bruits Left Right   Positive Positive  radial pulses are 2+ palpable ad = Aorta is palpable   VASCULAR EXAM: Extremities without ischemic changes  without Gangrene; without open wounds.     LE Pulses LEFT RIGHT   FEMORAL  palpable  palpable    POPLITEAL not palpable  not palpable   POSTERIOR TIBIAL not palpable   palpable    DORSALIS PEDIS  ANTERIOR TIBIAL palpable  palpable     Musculoskeletal: no muscle wasting or atrophy; no peripheral edema Neurologic: A&O X 3; Appropriate Affect ;  SENSATION: normal; MOTOR FUNCTION: 5/5 Symmetric, CN 2-12 intact Speech is fluent/normal           Non-Invasive Vascular Imaging (02/04/2015):   ABDOMINAL AORTA DUPLEX EVALUATION    INDICATION: Abdominal Aortic Aneurysm    PREVIOUS INTERVENTION(S): NA    DUPLEX EXAM:     LOCATION DIAMETER AP (cm) DIAMETER TRANSVERSE (cm) VELOCITIES (cm/sec)  Aorta Proximal 2.53 2.61 56  Aorta Mid 2.13 2.47 48  Aorta Distal 4.32 4.30 21  Right Common Iliac Artery 1.0 1.22 477  Left Common Iliac Artery 1.10 1.20 230    Previous max aortic diameter:  3.9 x 4.1  Date: 03/05/2014   ADDITIONAL FINDINGS:     IMPRESSION: Abdominal aortic aneurysm present measuring approximately 4.32cm AP x 4.30cm TRV, with non-occluding intramural thrombus present. Elevated velocities suggestive of greater than 50% stenosis present involving the proximal common iliac arteries.    Compared to the previous exam:  Increase in diameter since previous study on 03/05/2014.     ASSESSMENT:  Rebecca Tate is a 79 y.o. female who is s/p left renal artery stent placed in November, 2013 in the setting of difficult to control blood pressure and renal insufficiency.  She is also monitored for carotid artery stenosis and a AAA. She has no history of stroke or TIA. She denies back problems and returns today with similar umbilical area intermittent localized pain that she had in April 2015. Today's AAA Duplex reveals abdominal aortic aneurysm present measuring approximately 4.32cm AP x 4.30cm TRV, with non-occluding intramural thrombus present. Elevated velocities suggestive of greater than 50% stenosis present involving the proximal common iliac arteries. Insignificant increase in diameter of AAA in eleven months; this is not the source of her abdominal pain.  Dr. 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 I discussed patient's HPI, Duplex result, and patient's history with Dr. Scot Dock. See Plan.  PLAN:   Based on today's exam and non-invasive vascular lab results, and after discussing with Dr. Scot Dock, patient will return in October 2016 as scheduled for carotid Duplex and renal artery Duplex, and follow up with Dr. Trula Slade instead of me.  I discussed in depth with the patient the nature of atherosclerosis, and emphasized the importance of maximal medical management including strict  control of blood pressure, blood glucose, and lipid levels, obtaining regular exercise, and cessation of smoking.  The patient is aware that without maximal medical management the underlying atherosclerotic disease process will progress, limiting the benefit of any interventions. Consideration for repair of AAA would be made when the size approaches 4.8 or 5.0 cm, growth > 1 cm/yr, and symptomatic status. The patient was given information about  stroke prevention and what symptoms should prompt the patient to seek immediate medical care. The patient was given information about AAA including signs, symptoms, treatment,  what symptoms should prompt the patient to seek immediate medical care, and how to minimize the risk of enlargement and rupture of aneurysms. Thank you for allowing [place] to participate in this patient's care.  Clemon Chambers, RN, MSN, FNP-C [facility] Office: [phone]  Clinic MD: Scot Dock 02/04/2015 10:11 AM

## 2015-02-06 ENCOUNTER — Other Ambulatory Visit (HOSPITAL_COMMUNITY): Payer: Self-pay

## 2015-02-06 ENCOUNTER — Ambulatory Visit: Payer: Self-pay | Admitting: Family

## 2015-03-23 ENCOUNTER — Ambulatory Visit: Payer: Medicare Other | Admitting: Surgery

## 2015-03-23 ENCOUNTER — Other Ambulatory Visit (HOSPITAL_COMMUNITY): Payer: Self-pay

## 2015-03-23 ENCOUNTER — Other Ambulatory Visit (HOSPITAL_COMMUNITY): Payer: Medicare Other

## 2015-03-23 ENCOUNTER — Ambulatory Visit: Payer: Self-pay | Admitting: Surgery

## 2015-03-25 ENCOUNTER — Other Ambulatory Visit: Payer: Self-pay

## 2015-03-25 DIAGNOSIS — Z01812 Encounter for preprocedural laboratory examination: Secondary | ICD-10-CM

## 2015-03-25 DIAGNOSIS — N186 End stage renal disease: Secondary | ICD-10-CM

## 2015-03-26 ENCOUNTER — Telehealth: Payer: Self-pay | Admitting: Surgery

## 2015-03-26 NOTE — Telephone Encounter (Signed)
Left message for pt regarding an appointment on 04/17/15 with [facility], vein mapping prior, dpm

## 2015-03-26 NOTE — Telephone Encounter (Signed)
-----   Message from Denman George, RN sent at 03/25/2015  4:41 PM EDT ----- Regarding: needs appt. with [facility] Contact: [phone] This pt. needs appt. with Dr. Trula Slade to discuss/ schedule right arm AVF; seen Oct. 2015, and was advised to schedule right arm AVF, but hasn't decided to proceed until now.   She will need vein mapping, since it was last done 05/2014, and appt. w/ [facility]. (pt. specifically requested [facility])

## 2015-04-16 ENCOUNTER — Encounter: Payer: Self-pay | Admitting: Surgery

## 2015-04-17 ENCOUNTER — Other Ambulatory Visit: Payer: Self-pay

## 2015-04-17 ENCOUNTER — Encounter: Payer: Self-pay | Admitting: Surgery

## 2015-04-17 ENCOUNTER — Ambulatory Visit (HOSPITAL_COMMUNITY)
Admission: RE | Admit: 2015-04-17 | Discharge: 2015-04-17 | Disposition: A | Discharge status: Home / Self Care | Payer: Medicare Other | Source: Ambulatory Visit | Attending: Surgery | Admitting: Surgery

## 2015-04-17 ENCOUNTER — Ambulatory Visit (INDEPENDENT_AMBULATORY_CARE_PROVIDER_SITE_OTHER): Payer: Medicare Other | Admitting: Surgery

## 2015-04-17 VITALS — BP 176/77 | HR 68 | Resp 14 | Ht 64.0 in | Wt 130.0 lb

## 2015-04-17 DIAGNOSIS — N186 End stage renal disease: Secondary | ICD-10-CM | POA: Diagnosis not present

## 2015-04-17 DIAGNOSIS — N184 Chronic kidney disease, stage 4 (severe): Secondary | ICD-10-CM | POA: Diagnosis not present

## 2015-04-17 DIAGNOSIS — Z01812 Encounter for preprocedural laboratory examination: Secondary | ICD-10-CM | POA: Insufficient documentation

## 2015-04-17 NOTE — Progress Notes (Signed)
Patient name: Rebecca Tate MRN: [id number] DOB: [birth year] Sex: female     Chief Complaint  Patient presents with  . New Evaluation    evaluation for permanent access    HISTORY OF PRESENT ILLNESS: The Patient is a 79 y.o. year old female who presents for placement of a permanent hemodialysis access. The patient is right handed. The patient is not currently on hemodialysis. The cause of renal failure is thought to be secondary to hypertension. Other chronic medical problems include renal artery stent and is now on Plavix, Hypercholesterolemia not treated with a statin and Hypertension treated with Toprol. She denise diabetes.  I am following her for an abdominal aortic aneurysm (ultrasound from March showed a maximum diameter of 4.32 cm), history of left renal artery stenting in the setting of difficult to control blood pressure and renal insufficiency.  This had to be done from a left brachial approach., and carotid disease.  Past Medical History  Diagnosis Date  . Hypertension   . Hypothyroidism     thyroid nodule  . Hypercholesteremia   . Cataracts, bilateral   . Hx of adenomatous colonic polyps   . AAA (abdominal aortic aneurysm)   . Gallstones   . Glaucoma   . ANA positive   . Carotid artery occlusion   . Shortness of breath     "  at timesof exertion"  . Chronic kidney disease     left renal artery stent    Past Surgical History  Procedure Laterality Date  . Appendectomy    . Colonoscopy    . Eus  06/20/2012    Procedure: ESOPHAGEAL ENDOSCOPIC ULTRASOUND (EUS) RADIAL;  Surgeon: Arta Silence, MD;  Location: [facility];  Service: Endoscopy;  Laterality: N/A;  . Abdominal hysterectomy  1975    partial  . Renal artery stent Left     November, 2013, Dr. Trula Slade  . Colonoscopy with propofol N/A 09/03/2013    Procedure: COLONOSCOPY WITH PROPOFOL;  Surgeon: Garlan Fair, MD;  Location: [facility];  Service: Endoscopy;  Laterality: N/A;  . Renal  angiogram N/A 09/18/2012    Procedure: RENAL ANGIOGRAM;  Surgeon: Serafina Mitchell, MD;  Location: [facility] CATH LAB;  Service: Cardiovascular;  Laterality: N/A;  . Renal angiogram N/A 10/17/2012    Procedure: RENAL ANGIOGRAM;  Surgeon: Serafina Mitchell, MD;  Location: [facility] CATH LAB;  Service: Cardiovascular;  Laterality: N/A;  . Renal angiogram N/A 10/23/2012    Procedure: RENAL ANGIOGRAM;  Surgeon: Serafina Mitchell, MD;  Location: [facility] CATH LAB;  Service: Cardiovascular;  Laterality: N/A;  . Percutaneous stent intervention  10/23/2012    Procedure: PERCUTANEOUS STENT INTERVENTION;  Surgeon: Serafina Mitchell, MD;  Location: [facility] CATH LAB;  Service: Cardiovascular;;    History   Social History  . Marital Status: Single    Spouse Name: N/A  . Number of Children: N/A  . Years of Education: N/A   Occupational History  . Not on file.   Social History Main Topics  . Smoking status: Former Smoker -- 0.50 packs/day    Types: Cigarettes    Quit date: 12/05/2008  . Smokeless tobacco: Never Used  . Alcohol Use: 0.0 oz/week    0 Standard drinks or equivalent per week     Comment: rare glass of wine  . Drug Use: No  . Sexual Activity: Not Currently   Other Topics Concern  . Not on file   Social History Narrative    Family History  Problem Relation Age of Onset  . Heart disease Mother     After age 48  . Hypertension Mother   . Diabetes Mother   . Heart attack Mother   . Heart attack Father   . Diabetes Sister   . Hypertension Sister   . Diabetes Brother     Amputation  . Heart disease Brother     Heart Disease before age 21  . Hypertension Brother   . Heart attack Brother   . Peripheral vascular disease Brother     amputation    Allergies as of 04/17/2015 - Review Complete 02/04/2015  Allergen Reaction Noted  . Hydralazine Other (See Comments) 09/03/2012  . Penicillins Hives and Itching     Current Outpatient Prescriptions on File Prior to Visit  Medication Sig Dispense Refill    . amLODipine (NORVASC) 10 MG tablet Take 10 mg by mouth every morning.     . clopidogrel (PLAVIX) 75 MG tablet TAKE ONE TABLET BY MOUTH ONCE DAILY 30 tablet 11  . dorzolamide (TRUSOPT) 2 % ophthalmic solution Place 1 drop into both eyes 2 (two) times daily.    Marland Kitchen doxercalciferol (HECTOROL) 0.5 MCG capsule Take 1 mcg by mouth daily.    Marland Kitchen ezetimibe (ZETIA) 10 MG tablet Take 10 mg by mouth daily.    Marland Kitchen levothyroxine (SYNTHROID, LEVOTHROID) 75 MCG tablet Take 75 mcg by mouth every morning.     . metoprolol succinate (TOPROL-XL) 25 MG 24 hr tablet Take 25 mg by mouth every morning.      No current facility-administered medications on file prior to visit.     REVIEW OF SYSTEMS: Cardiovascular: No chest pain, chest pressure, palpitations, orthopnea, or dyspnea on exertion. No claudication or rest pain,  No history of DVT or phlebitis. Pulmonary: No productive cough, asthma or wheezing. Neurologic: No weakness, paresthesias, aphasia, or amaurosis. No dizziness. Hematologic: No bleeding problems or clotting disorders. Musculoskeletal: No joint pain or joint swelling. Gastrointestinal: No blood in stool or hematemesis Genitourinary: No dysuria or hematuria. Psychiatric:: No history of major depression. Integumentary: No rashes or ulcers. Constitutional: No fever or chills.  PHYSICAL EXAMINATION:   Vital signs are  Filed Vitals:   04/17/15 1140 04/17/15 1143  BP: 176/73 176/77  Pulse: 69 68  Resp: 14   Height: 5\' 4"  (1.626 m)   Weight: 130 lb (58.968 kg)    Body mass index is 22.3 kg/(m^2). General: The patient appears their stated age. HEENT:  No gross abnormalities Pulmonary:  Non labored breathing Musculoskeletal: There are no major deformities. Neurologic: No focal weakness or paresthesias are detected, Skin: There are no ulcer or rashes noted. Psychiatric: The patient has normal affect. Cardiovascular: There is a regular rate and rhythm without significant murmur appreciated.   Palpable right radial and brachial pulse.  Nonpalpable left brachial and radial pulse   Diagnostic Studies I have reviewed her vein mapping today.  The right cephalic vein measures from 0.24-0.2 one in the upper arm.  The basilic vein measures from 0.25-0.30 on the right She has a known left subclavian occlusion/stenosis  Assessment: Chronic renal insufficiency, stage V Plan: I discussed proceeding with a right arm access.  I will evaluate her cephalic vein in the operating room.  Based on today's imaging, I do not think that it is adequate.  If it is not adequate I would proceed with a first stage right basilic vein transposition.  I discussed the risks and benefits of the operation including the need for future surgeries  and/or interventions, and the risk of steal syndrome.  I discussed that if I proceed with a basilic vein transposition, first stage, that she would need to come back to have the vein elevated.  She is going to select a date for her surgery with my office.  Eldridge Abrahams, M.D. [facility] [place] Office: [phone] Pager:  [phone]

## 2015-04-17 NOTE — Progress Notes (Signed)
Filed Vitals:   04/17/15 1140 04/17/15 1143  BP: 176/73 176/77  Pulse: 69 68  Resp: 14   Height: 5\' 4"  (1.626 m)   Weight: 130 lb (58.968 kg)     Body mass index is 22.3 kg/(m^2).

## 2015-05-06 MED ORDER — SODIUM CHLORIDE 0.9 % IV SOLN: INTRAVENOUS | Status: DC

## 2015-05-06 MED ORDER — VANCOMYCIN HCL IN DEXTROSE 1-5 GM/200ML-% IV SOLN
1000.0000 mg | INTRAVENOUS | Status: AC
  Administered 2015-05-07: 1000 mg via INTRAVENOUS

## 2015-05-06 NOTE — Progress Notes (Signed)
Several unsuccessful attempts were made to contact pt. Pre-op instructions were left on pt home phone and mobile phone.

## 2015-05-06 NOTE — Progress Notes (Addendum)
Note charted on wrong pt.

## 2015-05-07 ENCOUNTER — Encounter (HOSPITAL_COMMUNITY)
Admission: RE | Disposition: A | Discharge status: Home / Self Care | Payer: Self-pay | Source: Ambulatory Visit | Attending: Surgery

## 2015-05-07 ENCOUNTER — Ambulatory Visit (HOSPITAL_COMMUNITY): Payer: Medicare Other | Admitting: Emergency Medicine

## 2015-05-07 ENCOUNTER — Other Ambulatory Visit: Payer: Medicare Other | Admitting: *Deleted

## 2015-05-07 ENCOUNTER — Ambulatory Visit (HOSPITAL_COMMUNITY)
Admission: RE | Admit: 2015-05-07 | Discharge: 2015-05-07 | Disposition: A | Discharge status: Home / Self Care | Payer: Medicare Other | Source: Ambulatory Visit | Attending: Surgery | Admitting: Surgery

## 2015-05-07 ENCOUNTER — Encounter (HOSPITAL_COMMUNITY): Payer: Self-pay | Admitting: Anesthesiology

## 2015-05-07 ENCOUNTER — Other Ambulatory Visit: Payer: Self-pay

## 2015-05-07 DIAGNOSIS — Z87891 Personal history of nicotine dependence: Secondary | ICD-10-CM | POA: Diagnosis not present

## 2015-05-07 DIAGNOSIS — E780 Pure hypercholesterolemia: Secondary | ICD-10-CM | POA: Insufficient documentation

## 2015-05-07 DIAGNOSIS — Z992 Dependence on renal dialysis: Secondary | ICD-10-CM | POA: Insufficient documentation

## 2015-05-07 DIAGNOSIS — I120 Hypertensive chronic kidney disease with stage 5 chronic kidney disease or end stage renal disease: Secondary | ICD-10-CM | POA: Insufficient documentation

## 2015-05-07 DIAGNOSIS — H409 Unspecified glaucoma: Secondary | ICD-10-CM | POA: Diagnosis not present

## 2015-05-07 DIAGNOSIS — I739 Peripheral vascular disease, unspecified: Secondary | ICD-10-CM | POA: Diagnosis not present

## 2015-05-07 DIAGNOSIS — Z79899 Other long term (current) drug therapy: Secondary | ICD-10-CM | POA: Diagnosis not present

## 2015-05-07 DIAGNOSIS — N186 End stage renal disease: Secondary | ICD-10-CM

## 2015-05-07 DIAGNOSIS — J449 Chronic obstructive pulmonary disease, unspecified: Secondary | ICD-10-CM | POA: Diagnosis not present

## 2015-05-07 DIAGNOSIS — I7789 Other specified disorders of arteries and arterioles: Secondary | ICD-10-CM | POA: Diagnosis not present

## 2015-05-07 DIAGNOSIS — E039 Hypothyroidism, unspecified: Secondary | ICD-10-CM | POA: Diagnosis not present

## 2015-05-07 DIAGNOSIS — Z7902 Long term (current) use of antithrombotics/antiplatelets: Secondary | ICD-10-CM | POA: Diagnosis not present

## 2015-05-07 DIAGNOSIS — I714 Abdominal aortic aneurysm, without rupture: Secondary | ICD-10-CM | POA: Insufficient documentation

## 2015-05-07 DIAGNOSIS — N185 Chronic kidney disease, stage 5: Secondary | ICD-10-CM | POA: Diagnosis not present

## 2015-05-07 DIAGNOSIS — Z4931 Encounter for adequacy testing for hemodialysis: Secondary | ICD-10-CM

## 2015-05-07 HISTORY — PX: AV FISTULA PLACEMENT: SHX1204

## 2015-05-07 LAB — POCT I-STAT 4, (NA,K, GLUC, HGB,HCT)
GLUCOSE: 90 mg/dL (ref 65–99)
HCT: 35 % — ABNORMAL LOW (ref 36.0–46.0)
Hemoglobin: 11.9 g/dL — ABNORMAL LOW (ref 12.0–15.0)
POTASSIUM: 3.8 mmol/L (ref 3.5–5.1)
SODIUM: 142 mmol/L (ref 135–145)

## 2015-05-07 SURGERY — ARTERIOVENOUS (AV) FISTULA CREATION
Anesthesia: Monitor Anesthesia Care | Site: Arm Upper | Laterality: Right

## 2015-05-07 MED ORDER — LIDOCAINE HCL (CARDIAC) 20 MG/ML IV SOLN
INTRAVENOUS | Status: DC | PRN
  Administered 2015-05-07: 20 mg via INTRAVENOUS
  Administered 2015-05-07: 40 mg via INTRAVENOUS

## 2015-05-07 MED ORDER — PHENYLEPHRINE HCL 10 MG/ML IJ SOLN
INTRAMUSCULAR | Status: DC | PRN
  Administered 2015-05-07 (×3): 40 ug via INTRAVENOUS

## 2015-05-07 MED ORDER — PROTAMINE SULFATE 10 MG/ML IV SOLN
INTRAVENOUS | Status: DC | PRN
  Administered 2015-05-07: 25 mg via INTRAVENOUS

## 2015-05-07 MED ORDER — 0.9 % SODIUM CHLORIDE (POUR BTL) OPTIME
TOPICAL | Status: DC | PRN
  Administered 2015-05-07: 1000 mL

## 2015-05-07 MED ORDER — HEMOSTATIC AGENTS (NO CHARGE) OPTIME
TOPICAL | Status: DC | PRN
  Administered 2015-05-07: 1 via TOPICAL

## 2015-05-07 MED ORDER — HEPARIN SODIUM (PORCINE) 1000 UNIT/ML IJ SOLN
INTRAMUSCULAR | Status: DC | PRN
  Administered 2015-05-07: 3000 [IU] via INTRAVENOUS

## 2015-05-07 MED ORDER — PROPOFOL 10 MG/ML IV BOLUS
INTRAVENOUS | Status: DC | PRN
  Administered 2015-05-07: 20 mg via INTRAVENOUS

## 2015-05-07 MED ORDER — GLYCOPYRROLATE 0.2 MG/ML IJ SOLN
INTRAMUSCULAR | Status: DC | PRN
  Administered 2015-05-07: 0.2 mg via INTRAVENOUS

## 2015-05-07 MED ORDER — LIDOCAINE-EPINEPHRINE (PF) 1 %-1:200000 IJ SOLN
INTRAMUSCULAR | Status: DC | PRN
  Administered 2015-05-07: 8 mL

## 2015-05-07 MED ORDER — ONDANSETRON HCL 4 MG/2ML IJ SOLN
INTRAMUSCULAR | Status: AC
  Filled 2015-05-07: qty 2

## 2015-05-07 MED ORDER — LIDOCAINE-EPINEPHRINE (PF) 1 %-1:200000 IJ SOLN
INTRAMUSCULAR | Status: AC
  Filled 2015-05-07: qty 10

## 2015-05-07 MED ORDER — SODIUM CHLORIDE 0.9 % IV SOLN
INTRAVENOUS | Status: DC
  Administered 2015-05-07: 09:00:00 via INTRAVENOUS

## 2015-05-07 MED ORDER — EPHEDRINE SULFATE 50 MG/ML IJ SOLN
INTRAMUSCULAR | Status: DC | PRN
  Administered 2015-05-07: 5 mg via INTRAVENOUS

## 2015-05-07 MED ORDER — CHLORHEXIDINE GLUCONATE CLOTH 2 % EX PADS: 6.0000 | MEDICATED_PAD | Freq: Once | CUTANEOUS | Status: DC

## 2015-05-07 MED ORDER — VANCOMYCIN HCL IN DEXTROSE 1-5 GM/200ML-% IV SOLN
INTRAVENOUS | Status: AC
  Filled 2015-05-07: qty 200

## 2015-05-07 MED ORDER — FENTANYL CITRATE (PF) 100 MCG/2ML IJ SOLN
INTRAMUSCULAR | Status: DC | PRN
  Administered 2015-05-07 (×2): 25 ug via INTRAVENOUS

## 2015-05-07 MED ORDER — ARTIFICIAL TEARS OP OINT
TOPICAL_OINTMENT | OPHTHALMIC | Status: AC
  Filled 2015-05-07: qty 3.5

## 2015-05-07 MED ORDER — OXYCODONE-ACETAMINOPHEN 5-325 MG PO TABS: 1.0000 | ORAL_TABLET | Freq: Four times a day (QID) | ORAL | Status: DC | PRN

## 2015-05-07 MED ORDER — PROPOFOL INFUSION 10 MG/ML OPTIME
INTRAVENOUS | Status: DC | PRN
  Administered 2015-05-07: 75 ug/kg/min via INTRAVENOUS

## 2015-05-07 MED ORDER — FENTANYL CITRATE (PF) 250 MCG/5ML IJ SOLN
INTRAMUSCULAR | Status: AC
  Filled 2015-05-07: qty 5

## 2015-05-07 MED ORDER — PROPOFOL 10 MG/ML IV BOLUS
INTRAVENOUS | Status: AC
  Filled 2015-05-07: qty 20

## 2015-05-07 MED ORDER — SODIUM CHLORIDE 0.9 % IR SOLN
Status: DC | PRN
  Administered 2015-05-07: 500 mL

## 2015-05-07 MED ORDER — CHLORHEXIDINE GLUCONATE CLOTH 2 % EX PADS
6.0000 | MEDICATED_PAD | Freq: Once | CUTANEOUS | Status: DC
Start: 2015-05-07 — End: 2015-05-07

## 2015-05-07 MED ORDER — ONDANSETRON HCL 4 MG/2ML IJ SOLN: 4.0000 mg | Freq: Once | INTRAMUSCULAR | Status: DC | PRN

## 2015-05-07 SURGICAL SUPPLY — 31 items
ARMBAND PINK RESTRICT EXTREMIT (MISCELLANEOUS) ×3 IMPLANT
CANISTER SUCTION 2500CC (MISCELLANEOUS) ×3 IMPLANT
CLIP TI MEDIUM 6 (CLIP) ×3 IMPLANT
CLIP TI WIDE RED SMALL 6 (CLIP) ×3 IMPLANT
COVER PROBE W GEL 5X96 (DRAPES) ×3 IMPLANT
ELECT REM PT RETURN 9FT ADLT (ELECTROSURGICAL) ×3
ELECTRODE REM PT RTRN 9FT ADLT (ELECTROSURGICAL) ×1 IMPLANT
GLOVE BIOGEL PI IND STRL 6.5 (GLOVE) IMPLANT
GLOVE BIOGEL PI IND STRL 7.5 (GLOVE) ×1 IMPLANT
GLOVE BIOGEL PI INDICATOR 6.5 (GLOVE) ×4
GLOVE BIOGEL PI INDICATOR 7.5 (GLOVE) ×4
GLOVE ECLIPSE 7.0 STRL STRAW (GLOVE) ×2 IMPLANT
GLOVE SURG SS PI 7.0 STRL IVOR (GLOVE) ×2 IMPLANT
GLOVE SURG SS PI 7.5 STRL IVOR (GLOVE) ×3 IMPLANT
GOWN STRL REUS W/ TWL LRG LVL3 (GOWN DISPOSABLE) ×2 IMPLANT
GOWN STRL REUS W/ TWL XL LVL3 (GOWN DISPOSABLE) ×1 IMPLANT
GOWN STRL REUS W/TWL LRG LVL3 (GOWN DISPOSABLE) ×6
GOWN STRL REUS W/TWL XL LVL3 (GOWN DISPOSABLE) ×6
HEMOSTAT SNOW SURGICEL 2X4 (HEMOSTASIS) ×2 IMPLANT
KIT BASIN OR (CUSTOM PROCEDURE TRAY) ×3 IMPLANT
KIT ROOM TURNOVER OR (KITS) ×3 IMPLANT
LIQUID BAND (GAUZE/BANDAGES/DRESSINGS) ×3 IMPLANT
NS IRRIG 1000ML POUR BTL (IV SOLUTION) ×3 IMPLANT
PACK CV ACCESS (CUSTOM PROCEDURE TRAY) ×3 IMPLANT
PAD ARMBOARD 7.5X6 YLW CONV (MISCELLANEOUS) ×6 IMPLANT
SUT PROLENE 6 0 CC (SUTURE) ×3 IMPLANT
SUT VIC AB 3-0 SH 27 (SUTURE) ×3
SUT VIC AB 3-0 SH 27X BRD (SUTURE) ×1 IMPLANT
SUT VICRYL 4-0 PS2 18IN ABS (SUTURE) ×2 IMPLANT
UNDERPAD 30X30 INCONTINENT (UNDERPADS AND DIAPERS) ×3 IMPLANT
WATER STERILE IRR 1000ML POUR (IV SOLUTION) ×3 IMPLANT

## 2015-05-07 NOTE — H&P (View-Only) (Signed)
Patient name: Rebecca Tate MRN: [id number] DOB: 07-Jun-1936 Sex: female     Chief Complaint  Patient presents with  . New Evaluation    evaluation for permanent access    HISTORY OF PRESENT ILLNESS: The Patient is a 79 y.o. year old female who presents for placement of a permanent hemodialysis access. The patient is right handed. The patient is not currently on hemodialysis. The cause of renal failure is thought to be secondary to hypertension. Other chronic medical problems include renal artery stent and is now on Plavix, Hypercholesterolemia not treated with a statin and Hypertension treated with Toprol. She denise diabetes.  I am following her for an abdominal aortic aneurysm (ultrasound from March showed a maximum diameter of 4.32 cm), history of left renal artery stenting in the setting of difficult to control blood pressure and renal insufficiency.  This had to be done from a left brachial approach., and carotid disease.  Past Medical History  Diagnosis Date  . Hypertension   . Hypothyroidism     thyroid nodule  . Hypercholesteremia   . Cataracts, bilateral   . Hx of adenomatous colonic polyps   . AAA (abdominal aortic aneurysm)   . Gallstones   . Glaucoma   . ANA positive   . Carotid artery occlusion   . Shortness of breath     "  at timesof exertion"  . Chronic kidney disease     left renal artery stent    Past Surgical History  Procedure Laterality Date  . Appendectomy    . Colonoscopy    . Eus  06/20/2012    Procedure: ESOPHAGEAL ENDOSCOPIC ULTRASOUND (EUS) RADIAL;  Surgeon: Arta Silence, MD;  Location: [facility];  Service: Endoscopy;  Laterality: N/A;  . Abdominal hysterectomy  1975    partial  . Renal artery stent Left     November, 2013, Dr. Trula Slade  . Colonoscopy with propofol N/A 09/03/2013    Procedure: COLONOSCOPY WITH PROPOFOL;  Surgeon: Garlan Fair, MD;  Location: [facility];  Service: Endoscopy;  Laterality: N/A;  . Renal  angiogram N/A 09/18/2012    Procedure: RENAL ANGIOGRAM;  Surgeon: Serafina Mitchell, MD;  Location: [facility] CATH LAB;  Service: Cardiovascular;  Laterality: N/A;  . Renal angiogram N/A 10/17/2012    Procedure: RENAL ANGIOGRAM;  Surgeon: Serafina Mitchell, MD;  Location: [facility] CATH LAB;  Service: Cardiovascular;  Laterality: N/A;  . Renal angiogram N/A 10/23/2012    Procedure: RENAL ANGIOGRAM;  Surgeon: Serafina Mitchell, MD;  Location: [facility] CATH LAB;  Service: Cardiovascular;  Laterality: N/A;  . Percutaneous stent intervention  10/23/2012    Procedure: PERCUTANEOUS STENT INTERVENTION;  Surgeon: Serafina Mitchell, MD;  Location: [facility] CATH LAB;  Service: Cardiovascular;;    History   Social History  . Marital Status: Single    Spouse Name: N/A  . Number of Children: N/A  . Years of Education: N/A   Occupational History  . Not on file.   Social History Main Topics  . Smoking status: Former Smoker -- 0.50 packs/day    Types: Cigarettes    Quit date: 12/05/2008  . Smokeless tobacco: Never Used  . Alcohol Use: 0.0 oz/week    0 Standard drinks or equivalent per week     Comment: rare glass of wine  . Drug Use: No  . Sexual Activity: Not Currently   Other Topics Concern  . Not on file   Social History Narrative    Family History  Problem Relation Age of Onset  . Heart disease Mother     After age 20  . Hypertension Mother   . Diabetes Mother   . Heart attack Mother   . Heart attack Father   . Diabetes Sister   . Hypertension Sister   . Diabetes Brother     Amputation  . Heart disease Brother     Heart Disease before age 44  . Hypertension Brother   . Heart attack Brother   . Peripheral vascular disease Brother     amputation    Allergies as of 04/17/2015 - Review Complete 02/04/2015  Allergen Reaction Noted  . Hydralazine Other (See Comments) 09/03/2012  . Penicillins Hives and Itching     Current Outpatient Prescriptions on File Prior to Visit  Medication Sig Dispense Refill    . amLODipine (NORVASC) 10 MG tablet Take 10 mg by mouth every morning.     . clopidogrel (PLAVIX) 75 MG tablet TAKE ONE TABLET BY MOUTH ONCE DAILY 30 tablet 11  . dorzolamide (TRUSOPT) 2 % ophthalmic solution Place 1 drop into both eyes 2 (two) times daily.    Marland Kitchen doxercalciferol (HECTOROL) 0.5 MCG capsule Take 1 mcg by mouth daily.    Marland Kitchen ezetimibe (ZETIA) 10 MG tablet Take 10 mg by mouth daily.    Marland Kitchen levothyroxine (SYNTHROID, LEVOTHROID) 75 MCG tablet Take 75 mcg by mouth every morning.     . metoprolol succinate (TOPROL-XL) 25 MG 24 hr tablet Take 25 mg by mouth every morning.      No current facility-administered medications on file prior to visit.     REVIEW OF SYSTEMS: Cardiovascular: No chest pain, chest pressure, palpitations, orthopnea, or dyspnea on exertion. No claudication or rest pain,  No history of DVT or phlebitis. Pulmonary: No productive cough, asthma or wheezing. Neurologic: No weakness, paresthesias, aphasia, or amaurosis. No dizziness. Hematologic: No bleeding problems or clotting disorders. Musculoskeletal: No joint pain or joint swelling. Gastrointestinal: No blood in stool or hematemesis Genitourinary: No dysuria or hematuria. Psychiatric:: No history of major depression. Integumentary: No rashes or ulcers. Constitutional: No fever or chills.  PHYSICAL EXAMINATION:   Vital signs are  Filed Vitals:   04/17/15 1140 04/17/15 1143  BP: 176/73 176/77  Pulse: 69 68  Resp: 14   Height: 5\' 4"  (1.626 m)   Weight: 130 lb (58.968 kg)    Body mass index is 22.3 kg/(m^2). General: The patient appears their stated age. HEENT:  No gross abnormalities Pulmonary:  Non labored breathing Musculoskeletal: There are no major deformities. Neurologic: No focal weakness or paresthesias are detected, Skin: There are no ulcer or rashes noted. Psychiatric: The patient has normal affect. Cardiovascular: There is a regular rate and rhythm without significant murmur appreciated.   Palpable right radial and brachial pulse.  Nonpalpable left brachial and radial pulse   Diagnostic Studies I have reviewed her vein mapping today.  The right cephalic vein measures from 0.24-0.2 one in the upper arm.  The basilic vein measures from 0.25-0.30 on the right She has a known left subclavian occlusion/stenosis  Assessment: Chronic renal insufficiency, stage V Plan: I discussed proceeding with a right arm access.  I will evaluate her cephalic vein in the operating room.  Based on today's imaging, I do not think that it is adequate.  If it is not adequate I would proceed with a first stage right basilic vein transposition.  I discussed the risks and benefits of the operation including the need for future surgeries  and/or interventions, and the risk of steal syndrome.  I discussed that if I proceed with a basilic vein transposition, first stage, that she would need to come back to have the vein elevated.  She is going to select a date for her surgery with my office.  Eldridge Abrahams, M.D. [facility] [place] Office: [phone] Pager:  [phone]

## 2015-05-07 NOTE — Op Note (Addendum)
    Patient name: Rebecca Tate MRN: [id number] DOB: 1936-05-05 Sex: female  05/07/2015 Pre-operative Diagnosis: Chronic renal insufficiency Post-operative diagnosis:  Same Surgeon:  [name] Assistants:  Gerri Lins Procedure:   First stage right basilic vein transposition Anesthesia:  Mac Blood Loss:  See anesthesia record Specimens:  None  Findings:  Mildly thickened brachial artery.  Basilic vein measured 2.5 mm and dilated to approximately 3 mm  Indications:  Patient comes in for dialysis access.  Vein mapping showed marginal cephalic and basilic veins.  She is here for attempted fistula creation  Procedure:  The patient was identified in the holding area and taken to [facility]  The patient was then placed supine on the table. MAC anesthesia was administered.  The patient was prepped and draped in the usual sterile fashion.  A time out was called and antibiotics were administered.  I evaluated the basilic and cephalic veins in the right upper arm.  The basilic vein appeared to be larger.  I therefore made an oblique incision at the antecubital crease.  I dissected out the basilic vein.  It actually looked adequate approximately 2.5 mm.  It was fully mobilized and side branches were ligated.  I then dissected free the brachial artery.  This was approximately a 3 mm artery which was mildly thickened.  3000 units of heparin were given.  The basilic vein was ligated distally.  It distended nicely to greater than 3 mm with heparinized saline.  The brachial artery was then occluded with Serafin clamps.  A #11 blade was used to make an arteriotomy which was extended longitudinally with Potts scissors.  The vein was cut to the appropriate length and spatulated to fit the size of the arteriotomy.  A running anastomosis was created with 60 proline.  Prior completion, the appropriate flushing maneuvers were performed and the anastomosis was completed.  The patient had a palpable radial pulse.   This excellent Doppler signal within the fistula.  0.5 mg of protamine was then administered.  The incision was closed with 2 layers of Vicryls followed by Dermabond.  There were no immediate complications.   Disposition:  To PACU in stable condition.   Theotis Burrow, M.D. [facility] [place] Office: [phone] Pager:  [phone]

## 2015-05-07 NOTE — Anesthesia Postprocedure Evaluation (Signed)
  Anesthesia Post-op Note  Patient: Rebecca Tate  Procedure(s) Performed: Procedure(s): FIRST STAGE BASILIC VEIN TRANSPOSITION RIGHT UPPER ARM (Right)  Patient Location: PACU  Anesthesia Type:MAC  Level of Consciousness: awake, alert , oriented and patient cooperative  Airway and Oxygen Therapy: Patient Spontanous Breathing  Post-op Pain: none  Post-op Assessment: Post-op Vital signs reviewed, Patient's Cardiovascular Status Stable, Respiratory Function Stable, Patent Airway, No signs of Nausea or vomiting and Pain level controlled  Post-op Vital Signs: stable  Last Vitals:  Filed Vitals:   05/07/15 1030  BP:   Pulse: 64  Temp:   Resp: 14    Complications: No apparent anesthesia complications

## 2015-05-07 NOTE — Interval H&P Note (Signed)
History and Physical Interval Note:  05/07/2015 9:05 AM  Rebecca Tate  has presented today for surgery, with the diagnosis of Stage V Chronic Kidney Disease N18.5  The various methods of treatment have been discussed with the patient and family. After consideration of risks, benefits and other options for treatment, the patient has consented to  Procedure(s): ARTERIOVENOUS (AV) FISTULA CREATION VERSUS FIRST STAGE BASILIC VEIN TRANSPOSITION (Right) as a surgical intervention .  The patient's history has been reviewed, patient examined, no change in status, stable for surgery.  I have reviewed the patient's chart and labs.  Questions were answered to the patient's satisfaction.     [name]

## 2015-05-07 NOTE — Anesthesia Preprocedure Evaluation (Signed)
Anesthesia Evaluation  Patient identified by MRN, date of birth, ID band Patient awake    Reviewed: Allergy & Precautions, NPO status , Patient's Chart, lab work & pertinent test results  Airway Mallampati: I       Dental   Pulmonary COPDformer smoker,    Pulmonary exam normal       Cardiovascular hypertension, + Peripheral Vascular Disease Normal cardiovascular exam    Neuro/Psych    GI/Hepatic   Endo/Other  Hypothyroidism   Renal/GU ESRF and DialysisRenal disease     Musculoskeletal   Abdominal   Peds  Hematology   Anesthesia Other Findings   Reproductive/Obstetrics                             Anesthesia Physical Anesthesia Plan  ASA: III  Anesthesia Plan: MAC   Post-op Pain Management:    Induction: Intravenous  Airway Management Planned: Simple Face Mask and LMA  Additional Equipment:   Intra-op Plan:   Post-operative Plan:   Informed Consent: I have reviewed the patients History and Physical, chart, labs and discussed the procedure including the risks, benefits and alternatives for the proposed anesthesia with the patient or authorized representative who has indicated his/her understanding and acceptance.     Plan Discussed with: CRNA, Anesthesiologist and Surgeon  Anesthesia Plan Comments:         Anesthesia Quick Evaluation

## 2015-05-07 NOTE — Transfer of Care (Signed)
Immediate Anesthesia Transfer of Care Note  Patient: Rebecca Tate  Procedure(s) Performed: Procedure(s): FIRST STAGE BASILIC VEIN TRANSPOSITION RIGHT UPPER ARM (Right)  Patient Location: PACU  Anesthesia Type:MAC  Level of Consciousness: awake, alert  and oriented  Airway & Oxygen Therapy: Patient Spontanous Breathing and Patient connected to nasal cannula oxygen  Post-op Assessment: Report given to RN and Post -op Vital signs reviewed and stable  Post vital signs: Reviewed and stable  Last Vitals:  Filed Vitals:   05/07/15 0825  BP: 180/75  Pulse: 66  Temp: 36.4 C  Resp: 16    Complications: No apparent anesthesia complications

## 2015-05-08 ENCOUNTER — Encounter (HOSPITAL_COMMUNITY): Payer: Self-pay | Admitting: Surgery

## 2015-05-08 ENCOUNTER — Telehealth: Payer: Self-pay | Admitting: Surgery

## 2015-05-08 NOTE — Telephone Encounter (Addendum)
sched appt 06/15/15, lab 1:30, md 3:00  Spoke to pt to inform them of appt  ----- Message from Mena Goes, RN sent at 05/07/2015 11:05 AM EDT ----- Regarding: Schedule   ----- Message -----    From: Ulyses Amor, PA-C    Sent: 05/07/2015  10:12 AM      To: [facility] Charge Pool  F/U in 6 weeks s/p right AV fistula needs duplex of fistula.

## 2015-05-19 ENCOUNTER — Telehealth: Payer: Self-pay

## 2015-05-19 ENCOUNTER — Encounter: Payer: Self-pay | Admitting: Surgery

## 2015-05-19 NOTE — Telephone Encounter (Addendum)
Phone call from pt.  Stated she spoke to Dr. Scot Dock this AM.  Was advised to call and make an appt.  Stated her right arm incision is very dry, and itchy; stated there is a scab in place.  Reported she has "a little redness" around the incision.  Denied drainage.  Denied any fever/ chills.  Denied opening in incision.  Stated "it looks so dry, and I just want someone to look at it, and tell me if there is anything I can do."    Offered an appt. Today @ 2:00 PM.  Stated she couldn't come until Friday for an appt.  Appt. given for 8:45 AM 6/17.  Advised to call office if there is any worsening of symptoms.  Verb. Understanding.

## 2015-05-22 ENCOUNTER — Encounter: Payer: Self-pay | Admitting: Surgery

## 2015-05-22 ENCOUNTER — Ambulatory Visit (INDEPENDENT_AMBULATORY_CARE_PROVIDER_SITE_OTHER): Payer: Self-pay | Admitting: Surgery

## 2015-05-22 VITALS — BP 155/90 | HR 72 | Resp 16 | Ht 64.0 in | Wt 130.0 lb

## 2015-05-22 DIAGNOSIS — N184 Chronic kidney disease, stage 4 (severe): Secondary | ICD-10-CM

## 2015-05-22 NOTE — Progress Notes (Signed)
Patient name: Rebecca Tate MRN: [id number] DOB: [birth year] Sex: female     Chief Complaint  Patient presents with  . Re-evaluation    check incision  right basilic vein transposition    HISTORY OF PRESENT ILLNESS: The patient is status post first stage basilic vein transposition on the right on 05/07/2015.  She complains of some itching around her incision.  She denies symptoms of steal.  She denies any swelling.  Past Medical History  Diagnosis Date  . Hypertension   . Hypothyroidism     thyroid nodule  . Hypercholesteremia   . Cataracts, bilateral   . Hx of adenomatous colonic polyps   . AAA (abdominal aortic aneurysm)   . Gallstones   . Glaucoma   . ANA positive   . Carotid artery occlusion   . Shortness of breath     "  at timesof exertion"  . Chronic kidney disease     left renal artery stent    Past Surgical History  Procedure Laterality Date  . Appendectomy    . Colonoscopy    . Eus  06/20/2012    Procedure: ESOPHAGEAL ENDOSCOPIC ULTRASOUND (EUS) RADIAL;  Surgeon: Arta Silence, MD;  Location: [facility];  Service: Endoscopy;  Laterality: N/A;  . Abdominal hysterectomy  1975    partial  . Renal artery stent Left     November, 2013, Dr. Trula Slade  . Colonoscopy with propofol N/A 09/03/2013    Procedure: COLONOSCOPY WITH PROPOFOL;  Surgeon: Garlan Fair, MD;  Location: [facility];  Service: Endoscopy;  Laterality: N/A;  . Renal angiogram N/A 09/18/2012    Procedure: RENAL ANGIOGRAM;  Surgeon: Serafina Mitchell, MD;  Location: [facility] CATH LAB;  Service: Cardiovascular;  Laterality: N/A;  . Renal angiogram N/A 10/17/2012    Procedure: RENAL ANGIOGRAM;  Surgeon: Serafina Mitchell, MD;  Location: [facility] CATH LAB;  Service: Cardiovascular;  Laterality: N/A;  . Renal angiogram N/A 10/23/2012    Procedure: RENAL ANGIOGRAM;  Surgeon: Serafina Mitchell, MD;  Location: [facility] CATH LAB;  Service: Cardiovascular;  Laterality: N/A;  . Percutaneous stent intervention  10/23/2012      Procedure: PERCUTANEOUS STENT INTERVENTION;  Surgeon: Serafina Mitchell, MD;  Location: [facility] CATH LAB;  Service: Cardiovascular;;  . Av fistula placement Right 05/07/2015    Procedure: FIRST STAGE BASILIC VEIN TRANSPOSITION RIGHT UPPER ARM;  Surgeon: Serafina Mitchell, MD;  Location: [place];  Service: Vascular;  Laterality: Right;    History   Social History  . Marital Status: Single    Spouse Name: N/A  . Number of Children: N/A  . Years of Education: N/A   Occupational History  . Not on file.   Social History Main Topics  . Smoking status: Former Smoker -- 0.50 packs/day    Types: Cigarettes    Quit date: 12/05/2008  . Smokeless tobacco: Never Used  . Alcohol Use: 0.0 oz/week    0 Standard drinks or equivalent per week     Comment: rare glass of wine  . Drug Use: No  . Sexual Activity: Not Currently   Other Topics Concern  . Not on file   Social History Narrative    Family History  Problem Relation Age of Onset  . Heart disease Mother     After age 23  . Hypertension Mother   . Diabetes Mother   . Heart attack Mother   . Heart attack Father   . Diabetes Sister   . Hypertension  Sister   . Diabetes Brother     Amputation  . Heart disease Brother     Heart Disease before age 45  . Hypertension Brother   . Heart attack Brother   . Peripheral vascular disease Brother     amputation    Allergies as of 05/22/2015 - Review Complete 05/22/2015  Allergen Reaction Noted  . Hydralazine Other (See Comments) 09/03/2012  . Penicillins Hives and Itching     Current Outpatient Prescriptions on File Prior to Visit  Medication Sig Dispense Refill  . amLODipine (NORVASC) 10 MG tablet Take 10 mg by mouth daily.     . calcitRIOL (ROCALTROL) 0.25 MCG capsule Take 0.25 mcg by mouth daily.     . clopidogrel (PLAVIX) 75 MG tablet TAKE ONE TABLET BY MOUTH ONCE DAILY 30 tablet 11  . dorzolamide-timolol (COSOPT) 22.3-6.8 MG/ML ophthalmic solution Place 1 drop into both eyes 2 (two)  times daily.     Marland Kitchen ezetimibe (ZETIA) 10 MG tablet Take 10 mg by mouth daily.    Marland Kitchen levothyroxine (SYNTHROID, LEVOTHROID) 75 MCG tablet Take 75 mcg by mouth daily before breakfast.     . metoprolol succinate (TOPROL-XL) 25 MG 24 hr tablet Take 25 mg by mouth daily.     Marland Kitchen oxyCODONE-acetaminophen (PERCOCET/ROXICET) 5-325 MG per tablet Take 1 tablet by mouth every 6 (six) hours as needed. (Patient not taking: Reported on 05/22/2015) 30 tablet 0   No current facility-administered medications on file prior to visit.     PHYSICAL EXAMINATION:   Vital signs are  Filed Vitals:   05/22/15 0905  BP: 155/90  Pulse: 72  Resp: 16  Height: 5\' 4"  (1.626 m)  Weight: 130 lb (58.968 kg)   Body mass index is 22.3 kg/(m^2). General: The patient appears their stated age. Her fistula has a good thrill.  No complications are noted..   Diagnostic Studies None  Assessment: Chronic renal insufficiency Plan: I reassured the patient that her postoperative course is completely normal.  There is a good thrill in her fistula.  She will follow-up in 4 weeks for an ultrasound to determine whether or not she is a candidate for a second stage procedure.  Eldridge Abrahams, M.D. [facility] [place] Office: [phone] Pager:  [phone]

## 2015-06-11 ENCOUNTER — Encounter: Payer: Self-pay | Admitting: Surgery

## 2015-06-15 ENCOUNTER — Ambulatory Visit (INDEPENDENT_AMBULATORY_CARE_PROVIDER_SITE_OTHER): Payer: Self-pay | Admitting: Surgery

## 2015-06-15 ENCOUNTER — Ambulatory Visit (HOSPITAL_COMMUNITY)
Admission: RE | Admit: 2015-06-15 | Discharge: 2015-06-15 | Disposition: A | Discharge status: Home / Self Care | Payer: Medicare Other | Source: Ambulatory Visit | Attending: Surgery | Admitting: Surgery

## 2015-06-15 ENCOUNTER — Encounter: Payer: Self-pay | Admitting: Surgery

## 2015-06-15 VITALS — BP 148/91 | HR 67 | Ht 64.0 in | Wt 127.5 lb

## 2015-06-15 DIAGNOSIS — Z4931 Encounter for adequacy testing for hemodialysis: Secondary | ICD-10-CM | POA: Insufficient documentation

## 2015-06-15 DIAGNOSIS — N186 End stage renal disease: Secondary | ICD-10-CM | POA: Diagnosis not present

## 2015-06-15 DIAGNOSIS — N185 Chronic kidney disease, stage 5: Secondary | ICD-10-CM

## 2015-06-15 NOTE — Progress Notes (Signed)
HISTORY AND PHYSICAL     CC:  F/u from 1st stage right basilic vein transposition Referring Provider:  Seward Carol, MD  HPI: This is a 79 y.o. female who is s/p right basilic vein transposition on 05/07/15.  She is doing quite well since surgery and denies any symptoms of steal.  She does have complaints of right forearm pain occasionally.    She does take a beta blocker and CCB for HTN.  She is on zetia for her cholesterol,but not a statin.  She is also on Plavix.    She is also being followed by Dr. Trula Slade for a 4.3cm AAA.   She also has a hx of stenting in the left renal artery for hypertension.  She also has carotid artery disease.  Past Medical History  Diagnosis Date  . Hypertension   . Hypothyroidism     thyroid nodule  . Hypercholesteremia   . Cataracts, bilateral   . Hx of adenomatous colonic polyps   . AAA (abdominal aortic aneurysm)   . Gallstones   . Glaucoma   . ANA positive   . Carotid artery occlusion   . Shortness of breath     "  at timesof exertion"  . Chronic kidney disease     left renal artery stent    Past Surgical History  Procedure Laterality Date  . Appendectomy    . Colonoscopy    . Eus  06/20/2012    Procedure: ESOPHAGEAL ENDOSCOPIC ULTRASOUND (EUS) RADIAL;  Surgeon: Arta Silence, MD;  Location: [facility];  Service: Endoscopy;  Laterality: N/A;  . Abdominal hysterectomy  1975    partial  . Renal artery stent Left     November, 2013, Dr. Trula Slade  . Colonoscopy with propofol N/A 09/03/2013    Procedure: COLONOSCOPY WITH PROPOFOL;  Surgeon: Garlan Fair, MD;  Location: [facility];  Service: Endoscopy;  Laterality: N/A;  . Renal angiogram N/A 09/18/2012    Procedure: RENAL ANGIOGRAM;  Surgeon: Serafina Mitchell, MD;  Location: [facility] CATH LAB;  Service: Cardiovascular;  Laterality: N/A;  . Renal angiogram N/A 10/17/2012    Procedure: RENAL ANGIOGRAM;  Surgeon: Serafina Mitchell, MD;  Location: [facility] CATH LAB;  Service: Cardiovascular;   Laterality: N/A;  . Renal angiogram N/A 10/23/2012    Procedure: RENAL ANGIOGRAM;  Surgeon: Serafina Mitchell, MD;  Location: [facility] CATH LAB;  Service: Cardiovascular;  Laterality: N/A;  . Percutaneous stent intervention  10/23/2012    Procedure: PERCUTANEOUS STENT INTERVENTION;  Surgeon: Serafina Mitchell, MD;  Location: [facility] CATH LAB;  Service: Cardiovascular;;  . Av fistula placement Right 05/07/2015    Procedure: FIRST STAGE BASILIC VEIN TRANSPOSITION RIGHT UPPER ARM;  Surgeon: Serafina Mitchell, MD;  Location: [place];  Service: Vascular;  Laterality: Right;    Allergies  Allergen Reactions  . Hydralazine Other (See Comments)    Liver inflamed  . Penicillins Hives and Itching    Current Outpatient Prescriptions  Medication Sig Dispense Refill  . amLODipine (NORVASC) 10 MG tablet Take 10 mg by mouth daily.     . calcitRIOL (ROCALTROL) 0.25 MCG capsule Take 0.25 mcg by mouth daily.     . clopidogrel (PLAVIX) 75 MG tablet TAKE ONE TABLET BY MOUTH ONCE DAILY 30 tablet 11  . dorzolamide-timolol (COSOPT) 22.3-6.8 MG/ML ophthalmic solution Place 1 drop into both eyes 2 (two) times daily.     Marland Kitchen ezetimibe (ZETIA) 10 MG tablet Take 10 mg by mouth daily.    Marland Kitchen levothyroxine (SYNTHROID,  LEVOTHROID) 75 MCG tablet Take 75 mcg by mouth daily before breakfast.     . metoprolol succinate (TOPROL-XL) 25 MG 24 hr tablet Take 25 mg by mouth daily.     Marland Kitchen oxyCODONE-acetaminophen (PERCOCET/ROXICET) 5-325 MG per tablet Take 1 tablet by mouth every 6 (six) hours as needed. (Patient not taking: Reported on 06/15/2015) 30 tablet 0   No current facility-administered medications for this visit.    Family History  Problem Relation Age of Onset  . Heart disease Mother     After age 78  . Hypertension Mother   . Diabetes Mother   . Heart attack Mother   . Heart attack Father   . Diabetes Sister   . Hypertension Sister   . Diabetes Brother     Amputation  . Heart disease Brother     Heart Disease before age 67  .  Hypertension Brother   . Heart attack Brother   . Peripheral vascular disease Brother     amputation    History   Social History  . Marital Status: Single    Spouse Name: N/A  . Number of Children: N/A  . Years of Education: N/A   Occupational History  . Not on file.   Social History Main Topics  . Smoking status: Former Smoker -- 0.50 packs/day    Types: Cigarettes    Quit date: 12/05/2008  . Smokeless tobacco: Never Used  . Alcohol Use: 0.0 oz/week    0 Standard drinks or equivalent per week     Comment: rare glass of wine  . Drug Use: No  . Sexual Activity: Not Currently   Other Topics Concern  . Not on file   Social History Narrative     ROS: Please see HPI   PHYSICAL EXAMINATION:  Filed Vitals:   06/15/15 1423  BP: 148/91  Pulse: 67   Body mass index is 21.87 kg/(m^2).  General:  WDWN in NAD Gait: Not observed HENT: WNL, normocephalic Pulmonary: normal non-labored breathing , without Rales, rhonchi,  wheezing Cardiac: RRR  Abdomen: soft, NT, no masses Skin: without rashes, without ulcers; well healed incision Vascular Exam/Pulses: +thrill within the fistula; 2+ palpable right radial pulse; grip is equal bilaterally Extremities: without ischemic changes, without Gangrene , without cellulitis; without open wounds;  Musculoskeletal: no muscle wasting or atrophy  Neurologic: A&O X 3; Appropriate Affect ; SENSATION: normal; MOTOR FUNCTION:  moving all extremities equally. Speech is fluent/normal   Non-Invasive Vascular Imaging:   Dialysis fistula duplex 06/15/15: -Patent right BVT  -Sclerotic distal venous outflow segment with elevated velocities and hemodynamically significant diameter changes present. -remainder of the venous outflow is patent to the level of the axilla  Pt meds includes: Statin:  No. Beta Blocker:  Yes.   Aspirin:  No. ACEI:  No. ARB:  No. Other Antiplatelet/Anticoagulant:  Yes.   Plavix   ASSESSMENT/PLAN:: 79 y.o. female  who is s/p 1st stage right BVT in need of 2nd stage BVT   -the pt's fistula has matured nicely and will need a 2nd stage BVT.   The pt would like to wait until the first of September as she does have a trip scheduled. -She does have a sclerotic narrowing just proximal to the anastomosis, which will need to be addressed at the time of surgery. -she will be scheduled for August 13, 2015 -she will need to be off of her Plavix for 5 days prior to the procedure    Leontine Locket, PA-C [facility] [phone]  Clinic MD:  Pt seen and examined in conjunction with Dr. Trula Slade  I agree with the above.  The patient has undergone first stage basilic vein transposition.  The vein has matured nicely.  There is a short segment area near the anastomosis with the vein did not dilate appropriately.  I discussed proceeding with second stage basilic vein transposition and likely resection of the area of concern.  The patient will need to be off Plavix for 5 days.  This will be scheduled when she returns from vacation in early September.   [name]

## 2015-06-26 ENCOUNTER — Other Ambulatory Visit: Payer: Self-pay

## 2015-07-17 ENCOUNTER — Other Ambulatory Visit: Payer: Self-pay | Admitting: Surgery

## 2015-08-12 ENCOUNTER — Encounter (HOSPITAL_COMMUNITY): Payer: Self-pay | Admitting: *Deleted

## 2015-08-12 NOTE — Progress Notes (Signed)
Pt denies any cardiac history, recent chest pain or sob.

## 2015-08-13 ENCOUNTER — Encounter (HOSPITAL_COMMUNITY)
Admission: RE | Disposition: A | Discharge status: Home / Self Care | Payer: Self-pay | Source: Ambulatory Visit | Attending: Surgery

## 2015-08-13 ENCOUNTER — Ambulatory Visit (HOSPITAL_COMMUNITY): Payer: Medicare Other | Admitting: Anesthesiology

## 2015-08-13 ENCOUNTER — Ambulatory Visit (HOSPITAL_COMMUNITY)
Admission: RE | Admit: 2015-08-13 | Discharge: 2015-08-13 | Disposition: A | Discharge status: Home / Self Care | Payer: Medicare Other | Source: Ambulatory Visit | Attending: Surgery | Admitting: Surgery

## 2015-08-13 ENCOUNTER — Encounter (HOSPITAL_COMMUNITY): Payer: Self-pay | Admitting: *Deleted

## 2015-08-13 DIAGNOSIS — N186 End stage renal disease: Secondary | ICD-10-CM | POA: Diagnosis not present

## 2015-08-13 DIAGNOSIS — Z79899 Other long term (current) drug therapy: Secondary | ICD-10-CM | POA: Diagnosis not present

## 2015-08-13 DIAGNOSIS — Z7902 Long term (current) use of antithrombotics/antiplatelets: Secondary | ICD-10-CM | POA: Diagnosis not present

## 2015-08-13 DIAGNOSIS — N185 Chronic kidney disease, stage 5: Secondary | ICD-10-CM | POA: Diagnosis not present

## 2015-08-13 DIAGNOSIS — E039 Hypothyroidism, unspecified: Secondary | ICD-10-CM | POA: Insufficient documentation

## 2015-08-13 DIAGNOSIS — E780 Pure hypercholesterolemia: Secondary | ICD-10-CM | POA: Diagnosis not present

## 2015-08-13 DIAGNOSIS — I120 Hypertensive chronic kidney disease with stage 5 chronic kidney disease or end stage renal disease: Secondary | ICD-10-CM | POA: Insufficient documentation

## 2015-08-13 DIAGNOSIS — I739 Peripheral vascular disease, unspecified: Secondary | ICD-10-CM | POA: Diagnosis not present

## 2015-08-13 DIAGNOSIS — Z87891 Personal history of nicotine dependence: Secondary | ICD-10-CM | POA: Diagnosis not present

## 2015-08-13 DIAGNOSIS — I714 Abdominal aortic aneurysm, without rupture: Secondary | ICD-10-CM | POA: Diagnosis not present

## 2015-08-13 HISTORY — DX: Unspecified osteoarthritis, unspecified site: M19.90

## 2015-08-13 HISTORY — PX: BASCILIC VEIN TRANSPOSITION: SHX5742

## 2015-08-13 LAB — POCT I-STAT 4, (NA,K, GLUC, HGB,HCT)
Glucose, Bld: 97 mg/dL (ref 65–99)
HCT: 35 % — ABNORMAL LOW (ref 36.0–46.0)
HEMOGLOBIN: 11.9 g/dL — AB (ref 12.0–15.0)
Potassium: 4.5 mmol/L (ref 3.5–5.1)
Sodium: 143 mmol/L (ref 135–145)

## 2015-08-13 SURGERY — TRANSPOSITION, VEIN, BASILIC
Anesthesia: General | Site: Arm Upper | Laterality: Right

## 2015-08-13 MED ORDER — 0.9 % SODIUM CHLORIDE (POUR BTL) OPTIME
TOPICAL | Status: DC | PRN
  Administered 2015-08-13: 1000 mL

## 2015-08-13 MED ORDER — FENTANYL CITRATE (PF) 100 MCG/2ML IJ SOLN
INTRAMUSCULAR | Status: DC | PRN
  Administered 2015-08-13 (×2): 25 ug via INTRAVENOUS

## 2015-08-13 MED ORDER — EPHEDRINE SULFATE 50 MG/ML IJ SOLN
INTRAMUSCULAR | Status: DC | PRN
  Administered 2015-08-13 (×2): 10 mg via INTRAVENOUS
  Administered 2015-08-13: 20 mg via INTRAVENOUS
  Administered 2015-08-13: 10 mg via INTRAVENOUS

## 2015-08-13 MED ORDER — VANCOMYCIN HCL IN DEXTROSE 1-5 GM/200ML-% IV SOLN
1000.0000 mg | INTRAVENOUS | Status: AC
  Administered 2015-08-13: 1000 mg via INTRAVENOUS
  Filled 2015-08-13: qty 200

## 2015-08-13 MED ORDER — CHLORHEXIDINE GLUCONATE CLOTH 2 % EX PADS: 6.0000 | MEDICATED_PAD | Freq: Once | CUTANEOUS | Status: DC

## 2015-08-13 MED ORDER — PROPOFOL 10 MG/ML IV BOLUS
INTRAVENOUS | Status: AC
  Filled 2015-08-13: qty 20

## 2015-08-13 MED ORDER — FENTANYL CITRATE (PF) 250 MCG/5ML IJ SOLN
INTRAMUSCULAR | Status: AC
  Filled 2015-08-13: qty 5

## 2015-08-13 MED ORDER — ONDANSETRON HCL 4 MG/2ML IJ SOLN
INTRAMUSCULAR | Status: DC | PRN
  Administered 2015-08-13: 4 mg via INTRAVENOUS

## 2015-08-13 MED ORDER — GLYCOPYRROLATE 0.2 MG/ML IJ SOLN
INTRAMUSCULAR | Status: DC | PRN
  Administered 2015-08-13: .2 mg via INTRAVENOUS

## 2015-08-13 MED ORDER — OXYCODONE-ACETAMINOPHEN 5-325 MG PO TABS: 1.0000 | ORAL_TABLET | Freq: Four times a day (QID) | ORAL | Status: DC | PRN

## 2015-08-13 MED ORDER — SODIUM CHLORIDE 0.9 % IR SOLN
Status: DC | PRN
  Administered 2015-08-13: 500 mL

## 2015-08-13 MED ORDER — HEPARIN SODIUM (PORCINE) 1000 UNIT/ML IJ SOLN
INTRAMUSCULAR | Status: DC | PRN
  Administered 2015-08-13: 4000 [IU] via INTRAVENOUS

## 2015-08-13 MED ORDER — FENTANYL CITRATE (PF) 100 MCG/2ML IJ SOLN
25.0000 ug | INTRAMUSCULAR | Status: DC | PRN
  Administered 2015-08-13: 50 ug via INTRAVENOUS

## 2015-08-13 MED ORDER — LIDOCAINE HCL (CARDIAC) 20 MG/ML IV SOLN
INTRAVENOUS | Status: DC | PRN
  Administered 2015-08-13: 50 mg via INTRAVENOUS

## 2015-08-13 MED ORDER — PROTAMINE SULFATE 10 MG/ML IV SOLN
INTRAVENOUS | Status: DC | PRN
  Administered 2015-08-13 (×2): 15 mg via INTRAVENOUS

## 2015-08-13 MED ORDER — LIDOCAINE-EPINEPHRINE (PF) 1 %-1:200000 IJ SOLN
INTRAMUSCULAR | Status: AC
  Filled 2015-08-13: qty 30

## 2015-08-13 MED ORDER — PROMETHAZINE HCL 25 MG/ML IJ SOLN: 6.2500 mg | INTRAMUSCULAR | Status: DC | PRN

## 2015-08-13 MED ORDER — PROPOFOL 10 MG/ML IV BOLUS
INTRAVENOUS | Status: DC | PRN
  Administered 2015-08-13: 120 mg via INTRAVENOUS
  Administered 2015-08-13: 30 mg via INTRAVENOUS

## 2015-08-13 MED ORDER — ALBUMIN HUMAN 5 % IV SOLN
INTRAVENOUS | Status: DC | PRN
  Administered 2015-08-13: 11:00:00 via INTRAVENOUS

## 2015-08-13 MED ORDER — FENTANYL CITRATE (PF) 100 MCG/2ML IJ SOLN
INTRAMUSCULAR | Status: AC
  Filled 2015-08-13: qty 2

## 2015-08-13 MED ORDER — HEMOSTATIC AGENTS (NO CHARGE) OPTIME
TOPICAL | Status: DC | PRN
  Administered 2015-08-13: 1 via TOPICAL

## 2015-08-13 MED ORDER — SODIUM CHLORIDE 0.9 % IV SOLN
INTRAVENOUS | Status: DC
  Administered 2015-08-13 (×2): via INTRAVENOUS

## 2015-08-13 MED ORDER — PHENYLEPHRINE HCL 10 MG/ML IJ SOLN
INTRAMUSCULAR | Status: DC | PRN
  Administered 2015-08-13 (×5): 80 ug via INTRAVENOUS

## 2015-08-13 MED ORDER — SODIUM CHLORIDE 0.9 % IV SOLN
10.0000 mg | INTRAVENOUS | Status: DC | PRN
  Administered 2015-08-13: 25 ug/min via INTRAVENOUS

## 2015-08-13 SURGICAL SUPPLY — 36 items
CANISTER SUCTION 2500CC (MISCELLANEOUS) ×3 IMPLANT
CLIP TI MEDIUM 24 (CLIP) IMPLANT
CLIP TI MEDIUM 6 (CLIP) ×2 IMPLANT
CLIP TI WIDE RED SMALL 24 (CLIP) IMPLANT
CLIP TI WIDE RED SMALL 6 (CLIP) ×2 IMPLANT
COVER PROBE W GEL 5X96 (DRAPES) ×3 IMPLANT
ELECT REM PT RETURN 9FT ADLT (ELECTROSURGICAL) ×3
ELECTRODE REM PT RTRN 9FT ADLT (ELECTROSURGICAL) ×1 IMPLANT
GLOVE BIO SURGEON STRL SZ 6.5 (GLOVE) ×2 IMPLANT
GLOVE BIO SURGEONS STRL SZ 6.5 (GLOVE) ×2
GLOVE BIOGEL PI IND STRL 6.5 (GLOVE) IMPLANT
GLOVE BIOGEL PI IND STRL 7.5 (GLOVE) ×1 IMPLANT
GLOVE BIOGEL PI INDICATOR 6.5 (GLOVE) ×4
GLOVE BIOGEL PI INDICATOR 7.5 (GLOVE) ×2
GLOVE ECLIPSE 7.5 STRL STRAW (GLOVE) ×2 IMPLANT
GLOVE SURG SS PI 7.5 STRL IVOR (GLOVE) ×3 IMPLANT
GOWN STRL REUS W/ TWL LRG LVL3 (GOWN DISPOSABLE) ×2 IMPLANT
GOWN STRL REUS W/ TWL XL LVL3 (GOWN DISPOSABLE) ×1 IMPLANT
GOWN STRL REUS W/TWL LRG LVL3 (GOWN DISPOSABLE) ×3
GOWN STRL REUS W/TWL XL LVL3 (GOWN DISPOSABLE) ×6
HEMOSTAT SNOW SURGICEL 2X4 (HEMOSTASIS) ×2 IMPLANT
KIT BASIN OR (CUSTOM PROCEDURE TRAY) ×3 IMPLANT
KIT ROOM TURNOVER OR (KITS) ×3 IMPLANT
LIQUID BAND (GAUZE/BANDAGES/DRESSINGS) ×5 IMPLANT
NS IRRIG 1000ML POUR BTL (IV SOLUTION) ×3 IMPLANT
PACK CV ACCESS (CUSTOM PROCEDURE TRAY) ×3 IMPLANT
PAD ARMBOARD 7.5X6 YLW CONV (MISCELLANEOUS) ×6 IMPLANT
SUT PROLENE 6 0 CC (SUTURE) ×3 IMPLANT
SUT SILK 2 0 SH (SUTURE) ×3 IMPLANT
SUT SILK 3 0 (SUTURE) ×3
SUT SILK 3-0 18XBRD TIE 12 (SUTURE) IMPLANT
SUT VIC AB 3-0 SH 27 (SUTURE) ×6
SUT VIC AB 3-0 SH 27X BRD (SUTURE) ×1 IMPLANT
SUT VICRYL 4-0 PS2 18IN ABS (SUTURE) ×4 IMPLANT
UNDERPAD 30X30 INCONTINENT (UNDERPADS AND DIAPERS) ×3 IMPLANT
WATER STERILE IRR 1000ML POUR (IV SOLUTION) ×3 IMPLANT

## 2015-08-13 NOTE — Anesthesia Preprocedure Evaluation (Addendum)
Anesthesia Evaluation  Patient identified by MRN, date of birth, ID band Patient awake    Reviewed: Allergy & Precautions, NPO status , Patient's Chart, lab work & pertinent test results  Airway Mallampati: II  TM Distance: >3 FB Neck ROM: Full    Dental no notable dental hx.    Pulmonary neg pulmonary ROS, former smoker,    Pulmonary exam normal breath sounds clear to auscultation       Cardiovascular hypertension, Pt. on medications + Peripheral Vascular Disease  Normal cardiovascular exam Rhythm:Regular Rate:Normal     Neuro/Psych negative neurological ROS  negative psych ROS   GI/Hepatic negative GI ROS, Neg liver ROS,   Endo/Other  Hypothyroidism   Renal/GU ESRFRenal disease  negative genitourinary   Musculoskeletal negative musculoskeletal ROS (+)   Abdominal   Peds negative pediatric ROS (+)  Hematology negative hematology ROS (+)   Anesthesia Other Findings   Reproductive/Obstetrics negative OB ROS                           Anesthesia Physical Anesthesia Plan  ASA: III  Anesthesia Plan: General   Post-op Pain Management:    Induction: Intravenous  Airway Management Planned: LMA  Additional Equipment:   Intra-op Plan:   Post-operative Plan:   Informed Consent: I have reviewed the patients History and Physical, chart, labs and discussed the procedure including the risks, benefits and alternatives for the proposed anesthesia with the patient or authorized representative who has indicated his/her understanding and acceptance.   Dental advisory given  Plan Discussed with: CRNA and Surgeon  Anesthesia Plan Comments:       Anesthesia Quick Evaluation

## 2015-08-13 NOTE — Anesthesia Procedure Notes (Signed)
Procedure Name: LMA Insertion Date/Time: 08/13/2015 10:28 AM Performed by: Neldon Newport Pre-anesthesia Checklist: Timeout performed, Patient being monitored, Suction available, Emergency Drugs available and Patient identified Patient Re-evaluated:Patient Re-evaluated prior to inductionOxygen Delivery Method: Circle system utilized Preoxygenation: Pre-oxygenation with 100% oxygen Intubation Type: IV induction Ventilation: Mask ventilation without difficulty LMA: LMA inserted LMA Size: 4.0 Number of attempts: 1 Placement Confirmation: breath sounds checked- equal and bilateral and positive ETCO2 Tube secured with: Tape Dental Injury: Teeth and Oropharynx as per pre-operative assessment

## 2015-08-13 NOTE — H&P (Signed)
HISTORY AND PHYSICAL     CC: F/u from 1st stage right basilic vein transposition Referring Provider: Seward Carol, MD  HPI: This is a 79 y.o. female who is s/p right basilic vein transposition on 05/07/15. She is doing quite well since surgery and denies any symptoms of steal. She does have complaints of right forearm pain occasionally.   She does take a beta blocker and CCB for HTN. She is on zetia for her cholesterol,but not a statin. She is also on Plavix.   She is also being followed by Dr. Trula Slade for a 4.3cm AAA. She also has a hx of stenting in the left renal artery for hypertension. She also has carotid artery disease.  Past Medical History  Diagnosis Date  . Hypertension   . Hypothyroidism     thyroid nodule  . Hypercholesteremia   . Cataracts, bilateral   . Hx of adenomatous colonic polyps   . AAA (abdominal aortic aneurysm)   . Gallstones   . Glaucoma   . ANA positive   . Carotid artery occlusion   . Shortness of breath     " at timesof exertion"  . Chronic kidney disease     left renal artery stent    Past Surgical History  Procedure Laterality Date  . Appendectomy    . Colonoscopy    . Eus  06/20/2012    Procedure: ESOPHAGEAL ENDOSCOPIC ULTRASOUND (EUS) RADIAL; Surgeon: Arta Silence, MD; Location: [facility]; Service: Endoscopy; Laterality: N/A;  . Abdominal hysterectomy  1975    partial  . Renal artery stent Left     November, 2013, Dr. Trula Slade  . Colonoscopy with propofol N/A 09/03/2013    Procedure: COLONOSCOPY WITH PROPOFOL; Surgeon: Garlan Fair, MD; Location: [facility]; Service: Endoscopy; Laterality: N/A;  . Renal angiogram N/A 09/18/2012    Procedure: RENAL ANGIOGRAM; Surgeon: Serafina Mitchell, MD; Location: [facility] CATH LAB; Service: Cardiovascular; Laterality: N/A;  . Renal angiogram N/A 10/17/2012    Procedure:  RENAL ANGIOGRAM; Surgeon: Serafina Mitchell, MD; Location: [facility] CATH LAB; Service: Cardiovascular; Laterality: N/A;  . Renal angiogram N/A 10/23/2012    Procedure: RENAL ANGIOGRAM; Surgeon: Serafina Mitchell, MD; Location: [facility] CATH LAB; Service: Cardiovascular; Laterality: N/A;  . Percutaneous stent intervention  10/23/2012    Procedure: PERCUTANEOUS STENT INTERVENTION; Surgeon: Serafina Mitchell, MD; Location: [facility] CATH LAB; Service: Cardiovascular;;  . Av fistula placement Right 05/07/2015    Procedure: FIRST STAGE BASILIC VEIN TRANSPOSITION RIGHT UPPER ARM; Surgeon: Serafina Mitchell, MD; Location: [place]; Service: Vascular; Laterality: Right;    Allergies  Allergen Reactions  . Hydralazine Other (See Comments)    Liver inflamed  . Penicillins Hives and Itching    Current Outpatient Prescriptions  Medication Sig Dispense Refill  . amLODipine (NORVASC) 10 MG tablet Take 10 mg by mouth daily.     . calcitRIOL (ROCALTROL) 0.25 MCG capsule Take 0.25 mcg by mouth daily.     . clopidogrel (PLAVIX) 75 MG tablet TAKE ONE TABLET BY MOUTH ONCE DAILY 30 tablet 11  . dorzolamide-timolol (COSOPT) 22.3-6.8 MG/ML ophthalmic solution Place 1 drop into both eyes 2 (two) times daily.     Marland Kitchen ezetimibe (ZETIA) 10 MG tablet Take 10 mg by mouth daily.    Marland Kitchen levothyroxine (SYNTHROID, LEVOTHROID) 75 MCG tablet Take 75 mcg by mouth daily before breakfast.     . metoprolol succinate (TOPROL-XL) 25 MG 24 hr tablet Take 25 mg by mouth daily.     Marland Kitchen oxyCODONE-acetaminophen (PERCOCET/ROXICET) 5-325  MG per tablet Take 1 tablet by mouth every 6 (six) hours as needed. (Patient not taking: Reported on 06/15/2015) 30 tablet 0   No current facility-administered medications for this visit.    Family History  Problem Relation Age of Onset  . Heart disease Mother     After age 38  . Hypertension Mother   . Diabetes  Mother   . Heart attack Mother   . Heart attack Father   . Diabetes Sister   . Hypertension Sister   . Diabetes Brother     Amputation  . Heart disease Brother     Heart Disease before age 98  . Hypertension Brother   . Heart attack Brother   . Peripheral vascular disease Brother     amputation    History   Social History  . Marital Status: Single    Spouse Name: N/A  . Number of Children: N/A  . Years of Education: N/A   Occupational History  . Not on file.   Social History Main Topics  . Smoking status: Former Smoker -- 0.50 packs/day    Types: Cigarettes    Quit date: 12/05/2008  . Smokeless tobacco: Never Used  . Alcohol Use: 0.0 oz/week    0 Standard drinks or equivalent per week     Comment: rare glass of wine  . Drug Use: No  . Sexual Activity: Not Currently   Other Topics Concern  . Not on file   Social History Narrative     ROS: Please see HPI   PHYSICAL EXAMINATION:  Filed Vitals:   06/15/15 1423  BP: 148/91  Pulse: 67   Body mass index is 21.87 kg/(m^2).  General: WDWN in NAD Gait: Not observed HENT: WNL, normocephalic Pulmonary: normal non-labored breathing , without Rales, rhonchi, wheezing Cardiac: RRR  Abdomen: soft, NT, no masses Skin: without rashes, without ulcers; well healed incision Vascular Exam/Pulses: +thrill within the fistula; 2+ palpable right radial pulse; grip is equal bilaterally Extremities: without ischemic changes, without Gangrene , without cellulitis; without open wounds;  Musculoskeletal: no muscle wasting or atrophy Neurologic: A&O X 3; Appropriate Affect ; SENSATION: normal; MOTOR FUNCTION: moving all extremities equally. Speech is fluent/normal   Non-Invasive Vascular Imaging:  Dialysis fistula duplex 06/15/15: -Patent right BVT  -Sclerotic distal venous outflow segment  with elevated velocities and hemodynamically significant diameter changes present. -remainder of the venous outflow is patent to the level of the axilla  Pt meds includes: Statin: No. Beta Blocker: Yes.  Aspirin: No. ACEI: No. ARB: No. Other Antiplatelet/Anticoagulant: Yes.  Plavix   ASSESSMENT/PLAN:: 79 y.o. female who is s/p 1st stage right BVT in need of 2nd stage BVT   -the pt's fistula has matured nicely and will need a 2nd stage BVT. The pt would like to wait until the first of September as she does have a trip scheduled. -She does have a sclerotic narrowing just proximal to the anastomosis, which will need to be addressed at the time of surgery. -she will be scheduled for August 13, 2015 -she will need to be off of her Plavix for 5 days prior to the procedure    Rebecca Locket, PA-C [facility] [phone]  Clinic MD: Pt seen and examined in conjunction with Dr. Trula Slade  I agree with the above. The patient has undergone first stage basilic vein transposition. The vein has matured nicely. There is a short segment area near the anastomosis with the vein did not dilate appropriately. I discussed proceeding  with second stage basilic vein transposition and likely resection of the area of concern. The patient will need to be off Plavix for 5 days. This will be scheduled when she returns from vacation in early September.   Rebecca Tate       No interval changes  Rebecca Tate

## 2015-08-13 NOTE — Progress Notes (Signed)
Dr.Rose notified of BP-states as long as pt remains asymptomatic-no new orders. Pt asymptomatic. Will cont to monitor.

## 2015-08-13 NOTE — Op Note (Signed)
    Patient name: Rebecca Tate MRN: [id number] DOB: Jun 05, 1936 Sex: female  08/13/2015 Pre-operative Diagnosis: End-stage renal disease Post-operative diagnosis:  Same Surgeon:  [name] Assistants:  Leontine Locket Procedure:   #1: Second stage right basilic vein transposition, including resection of a stenotic portion of the proximal vein, creating a new arteriovenous venous anastomosis in the more proximal brachial artery, and venous branch ligation 2 Anesthesia:  Gen. Blood Loss:  See anesthesia record Specimens:  None  Findings:  The vein closest to the arterial venous anastomosis was sclerotic which was consistent with what was visualized on ultrasound.  Therefore I resected this portion of the vein and we did the anastomosis to the brachial artery, and a slightly more proximal placed on the brachial artery.  Indications:  The patient is here for second stage basilic vein transposition.  Procedure:  The patient was identified in the holding area and taken to [place] [facility]  The patient was then placed supine on the table. general anesthesia was administered.  The patient was prepped and draped in the usual sterile fashion.  A time out was called and antibiotics were administered.  2 incisions were made in the upper arm directly anterior to the basilic vein.  Using a combination of cautery and sharp dissection the basilic vein was circumferentially exposed and encircled with a blue vessel loop.  I then fully mobilized the basilic vein from the antecubital crease up to the axilla.  The associated nerve was protected There were 2 large competing branches which were ligated between 3-0 silk ties.  Once the vein was fully mobilized, it was marked with an ink pen for orientation.  On the external portion of the vein he could clearly see where it was sclerotic, as indicated by the ultrasound.  I did not feel that I could simply resect this portion of the vein and perform a primary venous  anastomosis, but rather I needed to shorten the vein and therefore I had to redo the arteriovenous anastomosis.  I gave the patient 4000 units of heparin.  I ligated the basilic vein near the arterial venous anastomosis.  I then resected approximately 2 cm of the vein which included the sclerotic portion.  The remaining portion of the vein was widely patent.  I then exposed the brachial artery.  This was very soft artery about 3.5 mm.  It was occluded with vascular clamps.  A #11 blade was used to make an arteriotomy which was extended longitudinally with Potts scissors.  A end to side anastomosis was created between the artery and vein after spatulating the vein.  Prior to completion, the appropriate flushing maneuvers were performed and the anastomosis was completed.  All clamps were released.  There was an excellent thrill within the fistula.  The patient also had a multiphasic radial artery Doppler signal which did not change significantly with fistula compression.  Next 4 the elevation portion of the procedure, I reapproximated the subcutaneous tissue, posterior to the basilic vein with interrupted 30 Vicryls sutures, thus elevating the fistula near the skin.  I then reapproximated the skin directly anterior to the fistula with a running 40 Vicryls.  Dermabond was then applied.  There were no immediate complications.   Disposition:  To PACU in stable condition.   Theotis Burrow, M.D. [facility] [place] Office: [phone] Pager:  [phone]

## 2015-08-13 NOTE — Transfer of Care (Signed)
Immediate Anesthesia Transfer of Care Note  Patient: Rebecca Tate  Procedure(s) Performed: Procedure(s): RIGHT ARM SECOND STAGE BASILIC VEIN TRANSPOSITION (Right)  Patient Location: PACU  Anesthesia Type:MAC  Level of Consciousness: awake, alert  and oriented  Airway & Oxygen Therapy: Patient Spontanous Breathing and Patient connected to nasal cannula oxygen  Post-op Assessment: Report given to RN, Post -op Vital signs reviewed and stable and Patient moving all extremities X 4  Post vital signs: Reviewed and stable  Last Vitals:  Filed Vitals:   08/13/15 0808  BP: 110/68  Pulse: 64  Temp: 36.8 C  Resp: 20    Complications: No apparent anesthesia complications

## 2015-08-13 NOTE — Discharge Instructions (Signed)
° ° °  08/13/2015 Rebecca Tate [id number] 1936-05-09  Surgeon(s): Serafina Mitchell, MD  Procedure(s): RIGHT ARM SECOND STAGE BASILIC VEIN TRANSPOSITION  x Do not stick fistula for 6 weeks

## 2015-08-13 NOTE — Anesthesia Postprocedure Evaluation (Signed)
  Anesthesia Post-op Note  Patient: Rebecca Tate  Procedure(s) Performed: Procedure(s) (LRB): RIGHT ARM SECOND STAGE BASILIC VEIN TRANSPOSITION (Right)  Patient Location: PACU  Anesthesia Type: General  Level of Consciousness: awake and alert   Airway and Oxygen Therapy: Patient Spontanous Breathing  Post-op Pain: mild  Post-op Assessment: Post-op Vital signs reviewed, Patient's Cardiovascular Status Stable, Respiratory Function Stable, Patent Airway and No signs of Nausea or vomiting  Last Vitals:  Filed Vitals:   08/13/15 1227  BP: 93/61  Pulse: 75  Temp:   Resp: 22    Post-op Vital Signs: stable   Complications: No apparent anesthesia complications

## 2015-08-14 ENCOUNTER — Encounter (HOSPITAL_COMMUNITY): Payer: Self-pay | Admitting: Surgery

## 2015-08-14 ENCOUNTER — Telehealth: Payer: Self-pay | Admitting: Surgery

## 2015-08-14 NOTE — Telephone Encounter (Signed)
Spoke with pt to schedule, dpm °

## 2015-08-14 NOTE — Telephone Encounter (Signed)
-----   Message from Mena Goes, RN sent at 08/13/2015 12:54 PM EDT ----- Regarding: Schedule   ----- Message -----    From: Gabriel Earing, PA-C    Sent: 08/13/2015  12:15 PM      To: [facility] Charge Pool  S/p right 2nd stage BVT 08/13/15.  F/u in 4 weeks with Dr. Trula Slade.  No duplex needed.  Thanks

## 2015-08-17 ENCOUNTER — Telehealth: Payer: Self-pay | Admitting: *Deleted

## 2015-08-17 NOTE — Telephone Encounter (Signed)
Patient called to report that she had 1 episode of having blood on the dressing covering her incision yesterday. She had right arm 2nd stage BVT on 08-13-15 by Dr. Trula Slade. She states that she is afebrile, has no erythema, only slight amount of pain and no drainage other than the lemon sized amount of blood on the gauze dressing. She is able to move all fingers and has no pain in the hand. She is able to grasp objects without any difficulty. She does have slight numbness in her right hand at times. She has had no oozing or bleeding since the episode yesterday.  I reassured her that this was normal and I went over cleaning of the wound. She will put a light dressing over the wound to keep it from contact with her clothing. She will call us back if she has any more issues. Patient voiced understanding and agreement with this plan.

## 2015-09-11 ENCOUNTER — Encounter: Payer: Self-pay | Admitting: Surgery

## 2015-09-14 ENCOUNTER — Ambulatory Visit (INDEPENDENT_AMBULATORY_CARE_PROVIDER_SITE_OTHER): Payer: Self-pay | Admitting: Surgery

## 2015-09-14 ENCOUNTER — Encounter: Payer: Self-pay | Admitting: Surgery

## 2015-09-14 VITALS — BP 122/77 | HR 64 | Temp 98.2°F | Resp 16 | Ht 64.5 in | Wt 119.0 lb

## 2015-09-14 DIAGNOSIS — N185 Chronic kidney disease, stage 5: Secondary | ICD-10-CM

## 2015-09-14 NOTE — Progress Notes (Signed)
Patient name: Rebecca Tate MRN: [id number] DOB: [birth year] Sex: female     Chief Complaint  Patient presents with  . Routine Post Op    Pt states that she is having numbness in her right forearm, she is able to grasp objects with that hand.  She is currently not on HD.   She is afebrile and has no drainage from her incisions.     HISTORY OF PRESENT ILLNESS: The patient is back for follow-up.  On 08/13/2015, she underwent second stage right basilic vein transposition.  I resected a portion of the vein that was stenotic.  She is back today with complaints of numbness in her forearm.  She has good hand strength and does not report symptoms of steal  Past Medical History  Diagnosis Date  . Hypertension   . Hypothyroidism     thyroid nodule  . Hypercholesteremia   . Cataracts, bilateral   . Hx of adenomatous colonic polyps   . AAA (abdominal aortic aneurysm) ([place])   . Gallstones   . Glaucoma   . ANA positive   . Carotid artery occlusion   . Shortness of breath     "  at timesof exertion"  . Chronic kidney disease     left renal artery stent  . Arthritis     Past Surgical History  Procedure Laterality Date  . Appendectomy    . Colonoscopy    . Eus  06/20/2012    Procedure: ESOPHAGEAL ENDOSCOPIC ULTRASOUND (EUS) RADIAL;  Surgeon: Arta Silence, MD;  Location: [facility] END[facility];  Service: Endoscopy;  Laterality: N/A;  . Abdominal hysterectomy  1975    partial  . Renal artery stent Left     November, 2013, Dr. Trula Slade  . Colonoscopy with propofol N/A 09/03/2013    Procedure: COLONOSCOPY WITH PROPOFOL;  Surgeon: Garlan Fair, MD;  Location: [facility];  Service: Endoscopy;  Laterality: N/A;  . Renal angiogram N/A 09/18/2012    Procedure: RENAL ANGIOGRAM;  Surgeon: Serafina Mitchell, MD;  Location: [facility] CATH LAB;  Service: Cardiovascular;  Laterality: N/A;  . Renal angiogram N/A 10/17/2012    Procedure: RENAL ANGIOGRAM;  Surgeon: Serafina Mitchell, MD;  Location[facility] C[facility] LAB;   Service: Cardiovascular;  Laterality: N/A;  . Renal angiogram N/A 10/23/2012    Procedure: RENAL ANGIOGRAM;  Surgeon: Serafina Mitchell, MD;  Location[facility] C[facility] LAB;  Service: Cardiovascular;  Laterality: N/A;  . Percutaneous stent intervention  10/23/2012    Procedure: PERCUTANEOUS STENT INTERVENTION;  Surgeon: Serafina Mitchell, MD;  Location: [facility] CATH LAB;  Service: Cardiovascular;;  . Av fistula placement Right 05/07/2015    Procedure: FIRST STAGE BASILIC VEIN TRANSPOSITION RIGHT UPPER ARM;  Surgeon: Serafina Mitchell, MD;  Location: [place] OR;  Service: Vascular;  Laterality: Right;  . Eye surgery Left     cataract surgery  . Bascilic vein transposition Right 08/13/2015    Procedure: RIGHT ARM SECOND STAGE BASILIC VEIN TRANSPOSITION;  Surgeon: Serafina Mitchell, MD;  Location: [facility] OR;  Service: Vascular;  Laterality: Right;    Social History   Social History  . Marital Status: Single    Spouse Name: N/A  . Number of Children: N/A  . Years of Education: N/A   Occupational History  . Not on file.   Social History Main Topics  . Smoking status: Former Smoker -- 0.50 packs/day    Types: Cigarettes    Quit date: 12/05/2008  . Smokeless tobacco: Never Used  .  Alcohol Use: 0.0 oz/week    0 Standard drinks or equivalent per week     Comment: rare glass of wine  . Drug Use: No  . Sexual Activity: Not Currently   Other Topics Concern  . Not on file   Social History Narrative    Family History  Problem Relation Age of Onset  . Heart disease Mother     After age 57  . Hypertension Mother   . Diabetes Mother   . Heart attack Mother   . Heart attack Father   . Diabetes Sister   . Hypertension Sister   . Diabetes Brother     Amputation  . Heart disease Brother     Heart Disease before age 75  . Hypertension Brother   . Heart attack Brother   . Peripheral vascular disease Brother     amputation    Allergies as of 09/14/2015 - Review Complete 09/14/2015  Allergen Reaction Noted  .  Hydralazine Other (See Comments) 09/03/2012  . Penicillins Hives and Itching     Current Outpatient Prescriptions on File Prior to Visit  Medication Sig Dispense Refill  . amLODipine (NORVASC) 10 MG tablet Take 10 mg by mouth daily.     . calcitRIOL (ROCALTROL) 0.25 MCG capsule Take 0.25 mcg by mouth daily.     . clopidogrel (PLAVIX) 75 MG tablet TAKE ONE TABLET BY MOUTH ONCE DAILY 30 tablet 6  . dorzolamide-timolol (COSOPT) 22.3-6.8 MG/ML ophthalmic solution Place 1 drop into both eyes 2 (two) times daily.     Marland Kitchen ezetimibe (ZETIA) 10 MG tablet Take 10 mg by mouth daily.    Marland Kitchen levothyroxine (SYNTHROID, LEVOTHROID) 75 MCG tablet Take 75 mcg by mouth daily before breakfast.     . metoprolol succinate (TOPROL-XL) 25 MG 24 hr tablet Take 25 mg by mouth daily.     Marland Kitchen oxyCODONE-acetaminophen (PERCOCET/ROXICET) 5-325 MG per tablet Take 1 tablet by mouth every 6 (six) hours as needed. 20 tablet 0   No current facility-administered medications on file prior to visit.       PHYSICAL EXAMINATION:   Vital signs are  Filed Vitals:   09/14/15 1015  BP: 122/77  Pulse: 64  Temp: 98.2 F (36.8 C)  TempSrc: Oral  Resp: 16  Height: 5' 4.5" (1.638 m)  Weight: 119 lb (53.978 kg)  SpO2: 97%   Body mass index is 20.12 kg/(m^2). General: The patient appears their stated age. Excellent thrill within right basilic vein transposition.  Incision has healed nicely.  She has excellent grip strength.  There is no evidence of steal.  She does have numbness on the forearm.  Diagnostic Studies None  Assessment: Chronic renal insufficiency Plan: The patient's basilic vein transposition is working nicely.  Should be ready for use.  She does have some numbness in her forearm, which is likely a neurapraxia from mobilization of the basilic vein.  I suspect this will improve over time.  The patient will follow-up in a year with a renal artery duplex to evaluate her renal artery stent.  I told her she could stop  her Plavix today, because she is complaining about excessive bruising.  I would like for her to start a baby aspirin  V. Leia Alf, M.D. [facility] [place] Office: [phone] Pager:  [phone]

## 2015-09-14 NOTE — Addendum Note (Signed)
Addended by: Dorthula Rue L on: 09/14/2015 04:50 PM   Modules accepted: Orders

## 2015-09-28 ENCOUNTER — Other Ambulatory Visit (HOSPITAL_COMMUNITY): Payer: Medicare Other

## 2015-09-28 ENCOUNTER — Ambulatory Visit: Payer: Medicare Other | Admitting: Surgery

## 2015-09-28 ENCOUNTER — Ambulatory Visit: Payer: Medicare Other | Admitting: Family

## 2015-09-30 ENCOUNTER — Encounter: Payer: Self-pay | Admitting: Surgery

## 2015-10-05 ENCOUNTER — Encounter (HOSPITAL_COMMUNITY): Payer: Medicare Other

## 2015-10-05 ENCOUNTER — Ambulatory Visit (INDEPENDENT_AMBULATORY_CARE_PROVIDER_SITE_OTHER): Payer: Self-pay | Admitting: Surgery

## 2015-10-05 ENCOUNTER — Ambulatory Visit (HOSPITAL_COMMUNITY)
Admission: RE | Admit: 2015-10-05 | Discharge: 2015-10-05 | Disposition: A | Discharge status: Home / Self Care | Payer: Medicare Other | Source: Ambulatory Visit | Attending: Surgery | Admitting: Surgery

## 2015-10-05 ENCOUNTER — Encounter: Payer: Self-pay | Admitting: Surgery

## 2015-10-05 VITALS — BP 156/100 | HR 69 | Ht 64.5 in | Wt 122.9 lb

## 2015-10-05 DIAGNOSIS — I6523 Occlusion and stenosis of bilateral carotid arteries: Secondary | ICD-10-CM

## 2015-10-05 DIAGNOSIS — E7800 Pure hypercholesterolemia, unspecified: Secondary | ICD-10-CM | POA: Diagnosis not present

## 2015-10-05 DIAGNOSIS — N189 Chronic kidney disease, unspecified: Secondary | ICD-10-CM | POA: Insufficient documentation

## 2015-10-05 DIAGNOSIS — I6529 Occlusion and stenosis of unspecified carotid artery: Secondary | ICD-10-CM

## 2015-10-05 DIAGNOSIS — I129 Hypertensive chronic kidney disease with stage 1 through stage 4 chronic kidney disease, or unspecified chronic kidney disease: Secondary | ICD-10-CM | POA: Diagnosis not present

## 2015-10-05 NOTE — Addendum Note (Signed)
Addended by: Dorthula Rue L on: 10/05/2015 05:14 PM   Modules accepted: Orders

## 2015-10-05 NOTE — Progress Notes (Signed)
Patient name: Rebecca Tate MRN: [id number] DOB: 09-25-1936 Sex: female     Chief Complaint  Patient presents with  . Re-evaluation    f/u carotid    HISTORY OF PRESENT ILLNESS: The patient is back for follow-up.  She has a history of left renal artery stenting which had to be done from a right brachial approach.  Her renal function has gotten worse.  I recently completed a 2 stage basilic vein transposition for which I evaluated her 2 weeks ago and was doing fine with the exception of some numbness on the back of her arm.  She is here today for follow-up of her carotid studies.  She is neurologically intact without symptoms.   Past Medical History  Diagnosis Date  . Hypertension   . Hypothyroidism     thyroid nodule  . Hypercholesteremia   . Cataracts, bilateral   . Hx of adenomatous colonic polyps   . AAA (abdominal aortic aneurysm) ([place])   . Gallstones   . Glaucoma   . ANA positive   . Carotid artery occlusion   . Shortness of breath     "  at timesof exertion"  . Chronic kidney disease     left renal artery stent  . Arthritis     Past Surgical History  Procedure Laterality Date  . Appendectomy    . Colonoscopy    . Eus  06/20/2012    Procedure: ESOPHAGEAL ENDOSCOPIC ULTRASOUND (EUS) RADIAL;  Surgeon: Arta Silence, MD;  Location: [facility];  Service: Endoscopy;  Laterality: N/A;  . Abdominal hysterectomy  1975    partial  . Renal artery stent Left     November, 2013, Dr. Trula Slade  . Colonoscopy with propofol N/A 09/03/2013    Procedure: COLONOSCOPY WITH PROPOFOL;  Surgeon: Garlan Fair, MD;  Location: [facility];  Service: Endoscopy;  Laterality: N/A;  . Renal angiogram N/A 09/18/2012    Procedure: RENAL ANGIOGRAM;  Surgeon: Serafina Mitchell, MD;  [facility] CATH LAB;  Service: Cardiovascular;  Laterality: N/A;  . Renal angiogram N/A 10/17/2012    Procedure: RENAL ANGIOGRAM;  Surgeon: Serafina Mitchell, MD;  Location: [facility] CATH LAB;  Service:  Cardiovascular;  Laterality: N/A;  . Renal angiogram N/A 10/23/2012    Procedure: RENAL ANGIOGRAM;  Surgeon: Serafina Mitchell, MD;  Location: [facility] CATH LAB;  Service: Cardiovascular;  Laterality: N/A;  . Percutaneous stent intervention  10/23/2012    Procedure: PERCUTANEOUS STENT INTERVENTION;  Surgeon: Serafina Mitchell, MD;  Location: [facility] CATH LAB;  Service: Cardiovascular;;  . Av fistula placement Right 05/07/2015    Procedure: FIRST STAGE BASILIC VEIN TRANSPOSITION RIGHT UPPER ARM;  Surgeon: Serafina Mitchell, MD;  Location: [place] OR;  Service: Vascular;  Laterality: Right;  . Eye surgery Left     cataract surgery  . Bascilic vein transposition Right 08/13/2015    Procedure: RIGHT ARM SECOND STAGE BASILIC VEIN TRANSPOSITION;  Surgeon: Serafina Mitchell, MD;  Location: [facility] OR;  Service: Vascular;  Laterality: Right;    Social History   Social History  . Marital Status: Single    Spouse Name: N/A  . Number of Children: N/A  . Years of Education: N/A   Occupational History  . Not on file.   Social History Main Topics  . Smoking status: Former Smoker -- 0.50 packs/day    Types: Cigarettes    Quit date: 12/05/2008  . Smokeless tobacco: Never Used  . Alcohol Use: 0.0 oz/week  0 Standard drinks or equivalent per week     Comment: rare glass of wine  . Drug Use: No  . Sexual Activity: Not Currently   Other Topics Concern  . Not on file   Social History Narrative    Family History  Problem Relation Age of Onset  . Heart disease Mother     After age 51  . Hypertension Mother   . Diabetes Mother   . Heart attack Mother   . Heart attack Father   . Diabetes Sister   . Hypertension Sister   . Diabetes Brother     Amputation  . Heart disease Brother     Heart Disease before age 88  . Hypertension Brother   . Heart attack Brother   . Peripheral vascular disease Brother     amputation    Allergies as of 10/05/2015 - Review Complete 10/05/2015  Allergen Reaction Noted  . Hydralazine  Other (See Comments) 09/03/2012  . Penicillins Hives and Itching     Current Outpatient Prescriptions on File Prior to Visit  Medication Sig Dispense Refill  . amLODipine (NORVASC) 10 MG tablet Take 10 mg by mouth daily.     . calcitRIOL (ROCALTROL) 0.25 MCG capsule Take 0.25 mcg by mouth daily.     . dorzolamide-timolol (COSOPT) 22.3-6.8 MG/ML ophthalmic solution Place 1 drop into both eyes 2 (two) times daily.     Marland Kitchen ezetimibe (ZETIA) 10 MG tablet Take 10 mg by mouth daily.    Marland Kitchen levothyroxine (SYNTHROID, LEVOTHROID) 75 MCG tablet Take 75 mcg by mouth daily before breakfast.     . metoprolol succinate (TOPROL-XL) 25 MG 24 hr tablet Take 25 mg by mouth daily.     . clopidogrel (PLAVIX) 75 MG tablet TAKE ONE TABLET BY MOUTH ONCE DAILY (Patient not taking: Reported on 10/05/2015) 30 tablet 6  . oxyCODONE-acetaminophen (PERCOCET/ROXICET) 5-325 MG per tablet Take 1 tablet by mouth every 6 (six) hours as needed. (Patient not taking: Reported on 10/05/2015) 20 tablet 0   No current facility-administered medications on file prior to visit.     REVIEW OF SYSTEMS: Cardiovascular: No chest pain, chest pressure, palpitations, orthopnea, or dyspnea on exertion. No claudication or rest pain,  No history of DVT or phlebitis. Pulmonary: No productive cough, asthma or wheezing. Neurologic: No weakness, paresthesias, aphasia, or amaurosis. No dizziness. Hematologic: No bleeding problems or clotting disorders. Musculoskeletal: No joint pain or joint swelling. Gastrointestinal: No blood in stool or hematemesis Genitourinary: No dysuria or hematuria. Psychiatric:: No history of major depression. Integumentary: No rashes or ulcers. Constitutional: No fever or chills.  PHYSICAL EXAMINATION:   Vital signs are  Filed Vitals:   10/05/15 1228 10/05/15 1229  BP: 128/84 156/100  Pulse: 69   Height: 5' 4.5" (1.638 m)   Weight: 122 lb 14.4 oz (55.747 kg)   SpO2: 100%    Body mass index is 20.78  kg/(m^2). General: The patient appears their stated age. HEENT:  No gross abnormalities Pulmonary:  Non labored breathing Musculoskeletal: There are no major deformities. Neurologic: No focal weakness or paresthesias are detected, Skin: There are no ulcer or rashes noted. Psychiatric: The patient has normal affect. Cardiovascular: There is a regular rate and rhythm without significant murmur appreciated.   Diagnostic Studies I have ordered and reviewed her carotid ultrasound.  There are no significant changes from her previous exam one year ago.  She has 1-39 percent right internal carotid stenosis and 40-59 percent left internal carotid stenosis with a greater  than 50% distal common carotid stenosis  Assessment: #1: Carotid occlusive disease #2: Abdominal aortic aneurysm #3: Status post left renal artery stent #4: Dialysis access Plan: #1: No significant changes in her carotid study were determined today.  She will follow up in one year with a repeat duplex. #2: She will return in 1 year for follow-up of her abdominal aortic aneurysm which at her last study was 4.1 cm #3: She will need surveillance imaging of her left renal artery stent in one year. #4: She continues to have an excellent thrill within her fistula.  Eldridge Abrahams, M.D. [facility] [place] Office: [phone] Pager:  [phone]

## 2015-11-17 ENCOUNTER — Other Ambulatory Visit (HOSPITAL_COMMUNITY): Payer: Self-pay | Admitting: *Deleted

## 2015-11-18 ENCOUNTER — Encounter (HOSPITAL_COMMUNITY)
Admission: RE | Admit: 2015-11-18 | Discharge: 2015-11-18 | Disposition: A | Discharge status: Home / Self Care | Payer: Medicare Other | Source: Ambulatory Visit | Attending: Nephrology | Admitting: Nephrology

## 2015-11-18 DIAGNOSIS — D509 Iron deficiency anemia, unspecified: Secondary | ICD-10-CM | POA: Insufficient documentation

## 2015-11-18 MED ORDER — SODIUM CHLORIDE 0.9 % IV SOLN
510.0000 mg | INTRAVENOUS | Status: DC
  Administered 2015-11-18: 510 mg via INTRAVENOUS
  Filled 2015-11-18: qty 17

## 2015-11-26 ENCOUNTER — Encounter (HOSPITAL_COMMUNITY): Payer: Medicare Other

## 2015-11-27 ENCOUNTER — Encounter (HOSPITAL_COMMUNITY): Payer: Medicare Other

## 2015-12-03 ENCOUNTER — Encounter (HOSPITAL_COMMUNITY)
Admission: RE | Admit: 2015-12-03 | Discharge: 2015-12-03 | Disposition: A | Discharge status: Home / Self Care | Payer: Medicare Other | Source: Ambulatory Visit | Attending: Nephrology | Admitting: Nephrology

## 2015-12-03 DIAGNOSIS — D509 Iron deficiency anemia, unspecified: Secondary | ICD-10-CM | POA: Diagnosis not present

## 2015-12-03 MED ORDER — SODIUM CHLORIDE 0.9 % IV SOLN
510.0000 mg | INTRAVENOUS | Status: DC
  Administered 2015-12-03: 510 mg via INTRAVENOUS
  Filled 2015-12-03: qty 17

## 2016-01-13 ENCOUNTER — Emergency Department (HOSPITAL_COMMUNITY)
Admission: EM | Admit: 2016-01-13 | Discharge: 2016-01-13 | Disposition: A | Discharge status: Home / Self Care | Payer: Medicare Other | Attending: Emergency Medicine | Admitting: Emergency Medicine

## 2016-01-13 ENCOUNTER — Emergency Department (HOSPITAL_COMMUNITY): Payer: Medicare Other

## 2016-01-13 ENCOUNTER — Telehealth: Payer: Self-pay | Admitting: Surgery

## 2016-01-13 ENCOUNTER — Encounter (HOSPITAL_COMMUNITY): Payer: Self-pay | Admitting: Emergency Medicine

## 2016-01-13 DIAGNOSIS — N189 Chronic kidney disease, unspecified: Secondary | ICD-10-CM | POA: Insufficient documentation

## 2016-01-13 DIAGNOSIS — M1990 Unspecified osteoarthritis, unspecified site: Secondary | ICD-10-CM | POA: Insufficient documentation

## 2016-01-13 DIAGNOSIS — Z8719 Personal history of other diseases of the digestive system: Secondary | ICD-10-CM | POA: Diagnosis not present

## 2016-01-13 DIAGNOSIS — E039 Hypothyroidism, unspecified: Secondary | ICD-10-CM | POA: Insufficient documentation

## 2016-01-13 DIAGNOSIS — Z86010 Personal history of colonic polyps: Secondary | ICD-10-CM | POA: Insufficient documentation

## 2016-01-13 DIAGNOSIS — E7800 Pure hypercholesterolemia, unspecified: Secondary | ICD-10-CM | POA: Diagnosis not present

## 2016-01-13 DIAGNOSIS — R509 Fever, unspecified: Secondary | ICD-10-CM | POA: Diagnosis not present

## 2016-01-13 DIAGNOSIS — I129 Hypertensive chronic kidney disease with stage 1 through stage 4 chronic kidney disease, or unspecified chronic kidney disease: Secondary | ICD-10-CM | POA: Diagnosis not present

## 2016-01-13 DIAGNOSIS — Z87891 Personal history of nicotine dependence: Secondary | ICD-10-CM | POA: Insufficient documentation

## 2016-01-13 DIAGNOSIS — R519 Headache, unspecified: Secondary | ICD-10-CM

## 2016-01-13 DIAGNOSIS — H409 Unspecified glaucoma: Secondary | ICD-10-CM | POA: Insufficient documentation

## 2016-01-13 DIAGNOSIS — R51 Headache: Secondary | ICD-10-CM | POA: Diagnosis not present

## 2016-01-13 DIAGNOSIS — Z880 Allergy status to penicillin: Secondary | ICD-10-CM | POA: Diagnosis not present

## 2016-01-13 NOTE — ED Notes (Signed)
Pt made aware to return if symptoms worsen or if any life threatening symptoms occur.   

## 2016-01-13 NOTE — Telephone Encounter (Signed)
I called patient and I asked her to describe her headache.  She states that after church Sunday she went to bed shaking from being too cold.  She got to bed with blankets and a heater and was still shaking.  Eventually she says she calmed down somewhat and fell asleep.  When she awakened she states that she was burning up and had a white substance around her mouth.  Her headache started Monday.  When asked to rate her pain at its worse from 1-10 she states a 12 (twelve).  She has no other symptoms except for loss of appetite.  She describes the headache as being a  piercing pain in the back of her head.  I advised the patient to call 911 and report to the closest ED as soon as possible.  The patient voiced understanding of the instructions.

## 2016-01-13 NOTE — Discharge Instructions (Signed)

## 2016-01-13 NOTE — ED Provider Notes (Signed)
CSN: [id number]     Arrival date & time 01/13/16  1348 History   First MD Initiated Contact with Patient 01/13/16 1422     Chief Complaint  Patient presents with  . Headache     Patient is a 80 y.o. female presenting with headaches. The history is provided by the patient.  Headache Associated symptoms: fever   Associated symptoms: no abdominal pain, no back pain and no sinus pressure    patient has had a right-sided back of the head headache for the last couple days. Comes and goes. Lasts 15 minutes and go away. Sometimes come back immediately. No trauma. No fevers or chills. No vision changes. No chest pain. States that had some fever on Sunday but nothing since. She feels fine otherwise. She had previously been on Plavix but is not currently on it.  Past Medical History  Diagnosis Date  . Hypertension   . Hypothyroidism     thyroid nodule  . Hypercholesteremia   . Cataracts, bilateral   . Hx of adenomatous colonic polyps   . AAA (abdominal aortic aneurysm) ([place])   . Gallstones   . Glaucoma   . ANA positive   . Carotid artery occlusion   . Shortness of breath     "  at timesof exertion"  . Chronic kidney disease     left renal artery stent  . Arthritis    Past Surgical History  Procedure Laterality Date  . Appendectomy    . Colonoscopy    . Eus  06/20/2012    Procedure: ESOPHAGEAL ENDOSCOPIC ULTRASOUND (EUS) RADIAL;  Surgeon: Arta Silence, MD;  Location: [facility] ENDOSCOPY;  Service: Endoscopy;  Laterality: N/A;  . Abdominal hysterectomy  1975    partial  . Renal artery stent Left     November, 2013, Dr. Trula Slade  . Colonoscopy with propofol N/A 09/03/2013    Procedure: COLONOSCOPY WITH PROPOFOL;  Surgeon: Garlan Fair, MD;  Location: [facility] ENDOSCOPY;  Service: Endoscopy;  Laterality: N/A;  . Renal angiogram N/A 09/18/2012    Procedure: RENAL ANGIOGRAM;  Surgeon: Serafina Mitchell, MD;  Location: [facility] CATH LAB;  Service: Cardiovascular;  Laterality: N/A;  . Renal angiogram N/A  10/17/2012    Procedure: RENAL ANGIOGRAM;  Surgeon: Serafina Mitchell, MD;  Location: [facility] CATH LAB;  Service: Cardiovascular;  Laterality: N/A;  . Renal angiogram N/A 10/23/2012    Procedure: RENAL ANGIOGRAM;  Surgeon: Serafina Mitchell, MD;  Location: [facility] CATH LAB;  Service: Cardiovascular;  Laterality: N/A;  . Percutaneous stent intervention  10/23/2012    Procedure: PERCUTANEOUS STENT INTERVENTION;  Surgeon: Serafina Mitchell, MD;  Location: [facility] CATH LAB;  Service: Cardiovascular;;  . Av fistula placement Right 05/07/2015    Procedure: FIRST STAGE BASILIC VEIN TRANSPOSITION RIGHT UPPER ARM;  Surgeon: Serafina Mitchell, MD;  Location: [place] OR;  Service: Vascular;  Laterality: Right;  . Eye surgery Left     cataract surgery  . Bascilic vein transposition Right 08/13/2015    Procedure: RIGHT ARM SECOND STAGE BASILIC VEIN TRANSPOSITION;  Surgeon: Serafina Mitchell, MD;  Location: [facility] OR;  Service: Vascular;  Laterality: Right;   Family History  Problem Relation Age of Onset  . Heart disease Mother     After age 31  . Hypertension Mother   . Diabetes Mother   . Heart attack Mother   . Heart attack Father   . Diabetes Sister   . Hypertension Sister   . Diabetes Brother  Amputation  . Heart disease Brother     Heart Disease before age 18  . Hypertension Brother   . Heart attack Brother   . Peripheral vascular disease Brother     amputation   Social History  Substance Use Topics  . Smoking status: Former Smoker -- 0.50 packs/day    Types: Cigarettes    Quit date: 12/05/2008  . Smokeless tobacco: Never Used  . Alcohol Use: 0.0 oz/week    0 Standard drinks or equivalent per week     Comment: rare glass of wine   OB History    No data available     Review of Systems  Constitutional: Positive for fever. Negative for chills.  HENT: Negative for ear discharge, facial swelling and sinus pressure.   Respiratory: Negative for chest tightness.   Cardiovascular: Negative for chest pain.   Gastrointestinal: Negative for abdominal pain.  Genitourinary: Negative for flank pain.  Musculoskeletal: Negative for back pain.  Skin: Negative for wound.  Neurological: Positive for headaches.  Psychiatric/Behavioral: Negative for behavioral problems.      Allergies  Hydralazine and Penicillins  Home Medications   Prior to Admission medications   Medication Sig Start Date End Date Taking? Authorizing Provider  amLODipine (NORVASC) 10 MG tablet Take 10 mg by mouth daily.    Yes Historical Provider, MD  aspirin EC 81 MG tablet Take 81 mg by mouth daily.   Yes Historical Provider, MD  calcitRIOL (ROCALTROL) 0.25 MCG capsule Take 0.25 mcg by mouth daily.  04/16/15  Yes Historical Provider, MD  dorzolamide-timolol (COSOPT) 22.3-6.8 MG/ML ophthalmic solution Place 1 drop into both eyes 2 (two) times daily.  04/16/15  Yes Historical Provider, MD  ezetimibe (ZETIA) 10 MG tablet Take 10 mg by mouth daily.   Yes Historical Provider, MD  levothyroxine (SYNTHROID, LEVOTHROID) 75 MCG tablet Take 75 mcg by mouth daily before breakfast.    Yes Historical Provider, MD  metoprolol succinate (TOPROL-XL) 25 MG 24 hr tablet Take 25 mg by mouth daily.  08/29/12  Yes Historical Provider, MD  clopidogrel (PLAVIX) 75 MG tablet TAKE ONE TABLET BY MOUTH ONCE DAILY Patient not taking: Reported on 10/05/2015 07/17/15   Serafina Mitchell, MD  oxyCODONE-acetaminophen (PERCOCET/ROXICET) 5-325 MG per tablet Take 1 tablet by mouth every 6 (six) hours as needed. Patient not taking: Reported on 10/05/2015 08/13/15   Aldona Bar J Rhyne, PA-C   BP 97/58 mmHg  Pulse 83  Temp(Src) 99 F (37.2 C) (Oral)  Resp 15  Ht 5\' 5"  (1.651 m)  Wt 121 lb (54.885 kg)  BMI 20.14 kg/m2  SpO2 100% Physical Exam  Constitutional: She is oriented to person, place, and time. She appears well-developed and well-nourished.  HENT:  Head: Normocephalic and atraumatic.  Eyes: EOM are normal. Pupils are equal, round, and reactive to light.   Neck: Normal range of motion. Neck supple.  Cardiovascular: Normal rate, regular rhythm and normal heart sounds.   No murmur heard. Pulmonary/Chest: Effort normal and breath sounds normal. No respiratory distress. She has no wheezes. She has no rales.  Abdominal: Soft. Bowel sounds are normal. She exhibits no distension. There is no tenderness. There is no rebound.  Musculoskeletal: Normal range of motion.  Neurological: She is alert and oriented to person, place, and time. No cranial nerve deficit.  Skin: Skin is warm and dry.  Psychiatric: She has a normal mood and affect. Her speech is normal.  Nursing note and vitals reviewed.   ED Course  Procedures (including  critical care time) Labs Review Labs Reviewed - No data to display  Imaging Review Ct Head Wo Contrast  01/13/2016  CLINICAL DATA:  Right-sided headache, 4 days duration. EXAM: CT HEAD WITHOUT CONTRAST TECHNIQUE: Contiguous axial images were obtained from the base of the skull through the vertex without intravenous contrast. COMPARISON:  MRI 01/16/2007 FINDINGS: The brain does not show advanced atrophy. There is no evidence of old or acute focal small or large vessel infarction, mass lesion, hemorrhage, hydrocephalus or extra-axial collection. The calvarium is unremarkable. Sinuses, middle ears and mastoids are clear. There is atherosclerotic calcification of the major vessels at the base of the brain. IMPRESSION: Normal appearance of the brain for age. No cause of headache identified. Atherosclerotic calcification of the major vessels at the base of the brain. Electronically Signed   By: Nelson Chimes M.D.   On: 01/13/2016 15:23   I have personally reviewed and evaluated these images and lab results as part of my medical decision-making.   EKG Interpretation None      MDM   Final diagnoses:  Nonintractable headache, unspecified chronicity pattern, unspecified headache type     patient with headache. Nonfocal exam and  head CT reassuring. Will discharge home.    Davonna Belling, MD 01/13/16 [phone]

## 2016-01-13 NOTE — ED Notes (Signed)
Patient complaining of headache x 4 days. Patient ambulatory at triage. Patient has equal strength bilaterally. NAD noted at triage.

## 2016-01-13 NOTE — Telephone Encounter (Signed)
Patient left a message on triage voicemail at 11:13 am regarding headaches. The message was retrieved at approximately 11:20 am and the patient was called back by Leilani Merl, Practice Administrator.  The patient reported unusual headaches in the back of her head since Monday.  She had contacted Dr. Lina Sar office, but had not left a message when she learned Dr. Delfina Redwood was not in.  She said that she planned to go to the emergency room if she could not reach a doctor.  I told her that the choice to go to the emergency room was always hers, but that I would give her information to our triage nurse who would call her back to tell her whether or not this was related to the issue for which we treat her. Information give to [employer], LPN at approximately 12:45 pm, who called the patient immediately. Ovidio Hanger

## 2016-01-18 ENCOUNTER — Inpatient Hospital Stay (HOSPITAL_COMMUNITY)
Admission: EM | Admit: 2016-01-18 | Discharge: 2016-01-23 | DRG: 690 | Disposition: A | Discharge status: Home / Self Care | Payer: Medicare Other | Attending: Internal Medicine | Admitting: Internal Medicine

## 2016-01-18 ENCOUNTER — Encounter (HOSPITAL_COMMUNITY): Payer: Self-pay | Admitting: *Deleted

## 2016-01-18 ENCOUNTER — Emergency Department (HOSPITAL_COMMUNITY): Payer: Medicare Other

## 2016-01-18 DIAGNOSIS — I7143 Infrarenal abdominal aortic aneurysm, without rupture: Secondary | ICD-10-CM

## 2016-01-18 DIAGNOSIS — H269 Unspecified cataract: Secondary | ICD-10-CM | POA: Diagnosis present

## 2016-01-18 DIAGNOSIS — E860 Dehydration: Secondary | ICD-10-CM | POA: Diagnosis not present

## 2016-01-18 DIAGNOSIS — N184 Chronic kidney disease, stage 4 (severe): Secondary | ICD-10-CM | POA: Diagnosis not present

## 2016-01-18 DIAGNOSIS — B9620 Unspecified Escherichia coli [E. coli] as the cause of diseases classified elsewhere: Secondary | ICD-10-CM | POA: Diagnosis present

## 2016-01-18 DIAGNOSIS — E785 Hyperlipidemia, unspecified: Secondary | ICD-10-CM | POA: Diagnosis present

## 2016-01-18 DIAGNOSIS — Z7902 Long term (current) use of antithrombotics/antiplatelets: Secondary | ICD-10-CM

## 2016-01-18 DIAGNOSIS — N185 Chronic kidney disease, stage 5: Secondary | ICD-10-CM

## 2016-01-18 DIAGNOSIS — N390 Urinary tract infection, site not specified: Principal | ICD-10-CM

## 2016-01-18 DIAGNOSIS — E039 Hypothyroidism, unspecified: Secondary | ICD-10-CM | POA: Diagnosis not present

## 2016-01-18 DIAGNOSIS — R112 Nausea with vomiting, unspecified: Secondary | ICD-10-CM

## 2016-01-18 DIAGNOSIS — A084 Viral intestinal infection, unspecified: Secondary | ICD-10-CM | POA: Diagnosis not present

## 2016-01-18 DIAGNOSIS — I714 Abdominal aortic aneurysm, without rupture, unspecified: Secondary | ICD-10-CM | POA: Diagnosis present

## 2016-01-18 DIAGNOSIS — N179 Acute kidney failure, unspecified: Secondary | ICD-10-CM | POA: Diagnosis present

## 2016-01-18 DIAGNOSIS — K8020 Calculus of gallbladder without cholecystitis without obstruction: Secondary | ICD-10-CM | POA: Diagnosis not present

## 2016-01-18 DIAGNOSIS — E869 Volume depletion, unspecified: Secondary | ICD-10-CM | POA: Diagnosis not present

## 2016-01-18 DIAGNOSIS — E876 Hypokalemia: Secondary | ICD-10-CM | POA: Diagnosis present

## 2016-01-18 DIAGNOSIS — R109 Unspecified abdominal pain: Secondary | ICD-10-CM

## 2016-01-18 DIAGNOSIS — A09 Infectious gastroenteritis and colitis, unspecified: Secondary | ICD-10-CM | POA: Diagnosis not present

## 2016-01-18 DIAGNOSIS — E872 Acidosis: Secondary | ICD-10-CM | POA: Diagnosis present

## 2016-01-18 DIAGNOSIS — D72829 Elevated white blood cell count, unspecified: Secondary | ICD-10-CM | POA: Diagnosis not present

## 2016-01-18 DIAGNOSIS — Z7982 Long term (current) use of aspirin: Secondary | ICD-10-CM

## 2016-01-18 DIAGNOSIS — N19 Unspecified kidney failure: Secondary | ICD-10-CM

## 2016-01-18 DIAGNOSIS — N189 Chronic kidney disease, unspecified: Secondary | ICD-10-CM

## 2016-01-18 DIAGNOSIS — Z87891 Personal history of nicotine dependence: Secondary | ICD-10-CM | POA: Diagnosis not present

## 2016-01-18 DIAGNOSIS — R1084 Generalized abdominal pain: Secondary | ICD-10-CM

## 2016-01-18 DIAGNOSIS — I120 Hypertensive chronic kidney disease with stage 5 chronic kidney disease or end stage renal disease: Secondary | ICD-10-CM | POA: Diagnosis not present

## 2016-01-18 DIAGNOSIS — R197 Diarrhea, unspecified: Secondary | ICD-10-CM

## 2016-01-18 DIAGNOSIS — H409 Unspecified glaucoma: Secondary | ICD-10-CM | POA: Diagnosis not present

## 2016-01-18 LAB — URINALYSIS, ROUTINE W REFLEX MICROSCOPIC
Bilirubin Urine: NEGATIVE
GLUCOSE, UA: 100 mg/dL — AB
KETONES UR: NEGATIVE mg/dL
NITRITE: NEGATIVE
PROTEIN: 100 mg/dL — AB
Specific Gravity, Urine: 1.01 (ref 1.005–1.030)
pH: 6 (ref 5.0–8.0)

## 2016-01-18 LAB — CBC WITH DIFFERENTIAL/PLATELET
BASOS PCT: 0 %
Basophils Absolute: 0 10*3/uL (ref 0.0–0.1)
EOS ABS: 0 10*3/uL (ref 0.0–0.7)
EOS PCT: 0 %
HCT: 32.4 % — ABNORMAL LOW (ref 36.0–46.0)
HEMOGLOBIN: 10.9 g/dL — AB (ref 12.0–15.0)
LYMPHS ABS: 0.9 10*3/uL (ref 0.7–4.0)
Lymphocytes Relative: 5 %
MCH: 32.2 pg (ref 26.0–34.0)
MCHC: 33.6 g/dL (ref 30.0–36.0)
MCV: 95.6 fL (ref 78.0–100.0)
MONO ABS: 0.9 10*3/uL (ref 0.1–1.0)
Monocytes Relative: 5 %
NEUTROS ABS: 16.5 10*3/uL — AB (ref 1.7–7.7)
NEUTROS PCT: 90 %
Platelets: 561 10*3/uL — ABNORMAL HIGH (ref 150–400)
RBC: 3.39 MIL/uL — ABNORMAL LOW (ref 3.87–5.11)
RDW: 15 % (ref 11.5–15.5)
WBC: 18.3 10*3/uL — ABNORMAL HIGH (ref 4.0–10.5)

## 2016-01-18 LAB — COMPREHENSIVE METABOLIC PANEL
ALT: 12 U/L — AB (ref 14–54)
ANION GAP: 20 — AB (ref 5–15)
AST: 13 U/L — ABNORMAL LOW (ref 15–41)
Albumin: 3.2 g/dL — ABNORMAL LOW (ref 3.5–5.0)
Alkaline Phosphatase: 85 U/L (ref 38–126)
BUN: 95 mg/dL — ABNORMAL HIGH (ref 6–20)
CHLORIDE: 100 mmol/L — AB (ref 101–111)
CO2: 18 mmol/L — AB (ref 22–32)
CREATININE: 8.16 mg/dL — AB (ref 0.44–1.00)
Calcium: 8.9 mg/dL (ref 8.9–10.3)
GFR, EST AFRICAN AMERICAN: 5 mL/min — AB (ref >60)
GFR, EST NON AFRICAN AMERICAN: 4 mL/min — AB (ref >60)
Glucose, Bld: 148 mg/dL — ABNORMAL HIGH (ref 65–99)
POTASSIUM: 3.3 mmol/L — AB (ref 3.5–5.1)
SODIUM: 138 mmol/L (ref 135–145)
Total Bilirubin: 0.3 mg/dL (ref 0.3–1.2)
Total Protein: 8.2 g/dL — ABNORMAL HIGH (ref 6.5–8.1)

## 2016-01-18 LAB — I-STAT CG4 LACTIC ACID, ED: Lactic Acid, Venous: 1.89 mmol/L (ref 0.5–2.0)

## 2016-01-18 LAB — URINE MICROSCOPIC-ADD ON

## 2016-01-18 LAB — TSH: TSH: 1.685 u[IU]/mL (ref 0.350–4.500)

## 2016-01-18 LAB — LIPASE, BLOOD: LIPASE: 21 U/L (ref 11–51)

## 2016-01-18 MED ORDER — ASPIRIN EC 81 MG PO TBEC
81.0000 mg | DELAYED_RELEASE_TABLET | Freq: Every day | ORAL | Status: DC
  Administered 2016-01-18 – 2016-01-23 (×6): 81 mg via ORAL
  Filled 2016-01-18 (×6): qty 1

## 2016-01-18 MED ORDER — AMLODIPINE BESYLATE 10 MG PO TABS
10.0000 mg | ORAL_TABLET | Freq: Every day | ORAL | Status: DC
  Administered 2016-01-18 – 2016-01-20 (×3): 10 mg via ORAL
  Filled 2016-01-18 (×3): qty 1

## 2016-01-18 MED ORDER — ONDANSETRON HCL 4 MG/2ML IJ SOLN
4.0000 mg | Freq: Once | INTRAMUSCULAR | Status: AC
  Administered 2016-01-18: 4 mg via INTRAVENOUS
  Filled 2016-01-18: qty 2

## 2016-01-18 MED ORDER — SODIUM CHLORIDE 0.9% FLUSH
3.0000 mL | Freq: Two times a day (BID) | INTRAVENOUS | Status: DC
  Administered 2016-01-19 – 2016-01-20 (×2): 3 mL via INTRAVENOUS

## 2016-01-18 MED ORDER — MORPHINE SULFATE (PF) 4 MG/ML IV SOLN
4.0000 mg | Freq: Once | INTRAVENOUS | Status: AC
  Administered 2016-01-18: 4 mg via INTRAVENOUS

## 2016-01-18 MED ORDER — SODIUM CHLORIDE 0.9 % IV SOLN
INTRAVENOUS | Status: DC
  Administered 2016-01-18: 06:00:00 via INTRAVENOUS

## 2016-01-18 MED ORDER — CIPROFLOXACIN IN D5W 400 MG/200ML IV SOLN
400.0000 mg | Freq: Two times a day (BID) | INTRAVENOUS | Status: DC
  Administered 2016-01-18 – 2016-01-19 (×2): 400 mg via INTRAVENOUS
  Filled 2016-01-18 (×3): qty 200

## 2016-01-18 MED ORDER — CALCITRIOL 0.25 MCG PO CAPS
0.2500 ug | ORAL_CAPSULE | Freq: Every day | ORAL | Status: DC
  Administered 2016-01-18 – 2016-01-23 (×6): 0.25 ug via ORAL
  Filled 2016-01-18 (×6): qty 1

## 2016-01-18 MED ORDER — SODIUM CHLORIDE 0.9 % IV BOLUS (SEPSIS)
1000.0000 mL | Freq: Once | INTRAVENOUS | Status: AC
  Administered 2016-01-18: 1000 mL via INTRAVENOUS

## 2016-01-18 MED ORDER — CIPROFLOXACIN IN D5W 400 MG/200ML IV SOLN
400.0000 mg | Freq: Once | INTRAVENOUS | Status: AC
  Administered 2016-01-18: 400 mg via INTRAVENOUS
  Filled 2016-01-18: qty 200

## 2016-01-18 MED ORDER — SODIUM CHLORIDE 0.9 % IV SOLN: INTRAVENOUS | Status: DC

## 2016-01-18 MED ORDER — SENNOSIDES-DOCUSATE SODIUM 8.6-50 MG PO TABS: 1.0000 | ORAL_TABLET | Freq: Every evening | ORAL | Status: DC | PRN

## 2016-01-18 MED ORDER — DORZOLAMIDE HCL-TIMOLOL MAL 2-0.5 % OP SOLN: 1.0000 [drp] | Freq: Two times a day (BID) | OPHTHALMIC | Status: DC

## 2016-01-18 MED ORDER — FENTANYL CITRATE (PF) 100 MCG/2ML IJ SOLN
50.0000 ug | Freq: Once | INTRAMUSCULAR | Status: AC
  Administered 2016-01-18: 50 ug via INTRAVENOUS
  Filled 2016-01-18: qty 2

## 2016-01-18 MED ORDER — METOPROLOL SUCCINATE ER 25 MG PO TB24
25.0000 mg | ORAL_TABLET | Freq: Every day | ORAL | Status: DC
  Administered 2016-01-18 – 2016-01-23 (×6): 25 mg via ORAL
  Filled 2016-01-18 (×7): qty 1

## 2016-01-18 MED ORDER — LEVOTHYROXINE SODIUM 75 MCG PO TABS
75.0000 ug | ORAL_TABLET | Freq: Every day | ORAL | Status: DC
  Administered 2016-01-18 – 2016-01-23 (×6): 75 ug via ORAL
  Filled 2016-01-18 (×7): qty 1

## 2016-01-18 MED ORDER — ONDANSETRON HCL 4 MG/2ML IJ SOLN
4.0000 mg | Freq: Four times a day (QID) | INTRAMUSCULAR | Status: DC | PRN
  Administered 2016-01-19 – 2016-01-20 (×3): 4 mg via INTRAVENOUS
  Filled 2016-01-18 (×4): qty 2

## 2016-01-18 MED ORDER — ACETAMINOPHEN 650 MG RE SUPP: 650.0000 mg | Freq: Four times a day (QID) | RECTAL | Status: DC | PRN

## 2016-01-18 MED ORDER — NEPRO/CARBSTEADY PO LIQD
237.0000 mL | Freq: Two times a day (BID) | ORAL | Status: DC
  Administered 2016-01-19 – 2016-01-20 (×3): 237 mL via ORAL
  Filled 2016-01-18 (×6): qty 237

## 2016-01-18 MED ORDER — ACETAMINOPHEN 325 MG PO TABS
650.0000 mg | ORAL_TABLET | Freq: Four times a day (QID) | ORAL | Status: DC | PRN
  Administered 2016-01-18 – 2016-01-19 (×2): 650 mg via ORAL
  Filled 2016-01-18: qty 2

## 2016-01-18 MED ORDER — EZETIMIBE 10 MG PO TABS
10.0000 mg | ORAL_TABLET | Freq: Every day | ORAL | Status: DC
  Administered 2016-01-18 – 2016-01-23 (×6): 10 mg via ORAL
  Filled 2016-01-18 (×6): qty 1

## 2016-01-18 MED ORDER — ONDANSETRON HCL 4 MG PO TABS: 4.0000 mg | ORAL_TABLET | Freq: Four times a day (QID) | ORAL | Status: DC | PRN

## 2016-01-18 MED ORDER — ONDANSETRON HCL 4 MG/2ML IJ SOLN
INTRAMUSCULAR | Status: AC
  Administered 2016-01-18: 09:00:00
  Filled 2016-01-18: qty 2

## 2016-01-18 MED ORDER — MORPHINE SULFATE (PF) 4 MG/ML IV SOLN
INTRAVENOUS | Status: AC
  Filled 2016-01-18: qty 1

## 2016-01-18 MED ORDER — SODIUM CHLORIDE 0.9 % IV SOLN: INTRAVENOUS | Status: AC

## 2016-01-18 MED ORDER — ONDANSETRON HCL 4 MG/2ML IJ SOLN
4.0000 mg | Freq: Once | INTRAMUSCULAR | Status: AC
  Administered 2016-01-18: 4 mg via INTRAVENOUS

## 2016-01-18 MED ORDER — ONDANSETRON HCL 4 MG/2ML IJ SOLN
INTRAMUSCULAR | Status: AC
  Filled 2016-01-18: qty 2

## 2016-01-18 NOTE — Progress Notes (Signed)
Carry over  80 year old female came to the ED with abdominal pain vomiting and diarrhea. Patient has a history of abdominal aortic aneurysm. In the ED lab book showed leukocytosis, non-gap metabolic acidosis, acute on chronic kidney disease. CT scan of the abdomen showed no colitis, bowel obstruction, diverticulitis. She has cholelithiasis without other signs of cholecystitis. She has an infrarenal abdominal aortic aneurysm measuring 4.5 cm in AP dimension and 4.87 m in transverse dimension. There are internal calcifications within the abdominal aortic aneurysm that may be due to dystrophic calcification it also could indicate displaced intimal calcification and therefore aortic dissection is not excluded.  Dr. Leonides Schanz discussed with vascular surgeon Dr. Doren Custard who reviewed patient's imaging and said there was no change in size of patient's aneurysm and no signs of rupture.He agrees with transfer to [facility] so that she can be evaluated by vascular surgery.   Patient also has gastroenteritis, UTI with acute on chronic kidney disease. Urine culture has been obtained. Also received IV ceftriaxone.  I called and discussed with Dr. Blaine Hamper at [facility], who is accepting physician for transfer.

## 2016-01-18 NOTE — ED Notes (Signed)
Report given to [facility] with [facility] at this time.

## 2016-01-18 NOTE — ED Notes (Signed)
Pt will get CT done around 4:30am.  Pt and family aware

## 2016-01-18 NOTE — ED Notes (Signed)
Patient on cardiac monitoring at this time. 

## 2016-01-18 NOTE — H&P (Signed)
[facility]          History and Physical    PCP:   Kandice Hams, MD   EDP: Wyona Almas, M.D.  Chief Complaint:  Nausea, vomiting, diarrhea, abdominal pain  HPI: Patient is a 80 H old woman with multiple medical comorbidities including stage V chronic kidney disease, hypothyroidism, AAA, hypertension, hyperlipidemia, left renal artery stenosis status post stenting who presents to the hospital with a 2 day history of lower abdominal pain with nausea and diarrhea. She has not had any further episodes of emesis since this morning. Upon arrival to the emergency department she was found to have a UTI, labs are significant for a worsening of her chronic kidney disease with a creatinine of 8.16, bicarbonate of 18, potassium 3.3, WBC count of 18.3. Because of her history of a AAA CT scan was obtained, without contrast given her chronic kidney disease, that showed an infrarenal abdominal aortic aneurysm measuring 4.5 cm with possible displaced intimal calcification which could indicate dissection, no retroperitoneal hematoma. CT scan was discussed by EDP with Dr. Doren Custard with vascular surgery who thought that there was no change in size of the patient's aneurysm and no signs of imminent rupture, he did however suggest patient be transferred to [facility] for vascular surgery to evaluate. I have been asked to admit her for further evaluation and management.  Allergies:   Allergies  Allergen Reactions  . Hydralazine Other (See Comments)    Liver inflamed  . Penicillins Hives and Itching    Has patient had a PCN reaction causing immediate rash, facial/tongue/throat swelling, SOB or lightheadedness with hypotension: Yes Has patient had a PCN reaction causing severe rash involving mucus membranes or skin necrosis: No Has patient had a PCN reaction that required hospitalization No Has patient had a PCN reaction occurring within the last 10 years: No If all of the above answers  are "NO", then may proceed with Cephalosporin use.        Past Medical History  Diagnosis Date  . Hypertension   . Hypothyroidism     thyroid nodule  . Hypercholesteremia   . Cataracts, bilateral   . Hx of adenomatous colonic polyps   . AAA (abdominal aortic aneurysm) ([place])   . Gallstones   . Glaucoma   . ANA positive   . Carotid artery occlusion   . Shortness of breath     "  at timesof exertion"  . Chronic kidney disease     left renal artery stent  . Arthritis     Past Surgical History  Procedure Laterality Date  . Appendectomy    . Colonoscopy    . Eus  06/20/2012    Procedure: ESOPHAGEAL ENDOSCOPIC ULTRASOUND (EUS) RADIAL;  Surgeon: Arta Silence, MD;  Location: [facility] ENDOSCOPY;  Service: Endoscopy;  Laterality: N/A;  . Abdominal hysterectomy  1975    partial  . Renal artery stent Left     November, 2013, Dr. Trula Slade  . Colonoscopy with propofol N/A 09/03/2013    Procedure: COLONOSCOPY WITH PROPOFOL;  Surgeon: Garlan Fair, MD;  Location: [facility] ENDOSCOPY;  Service: Endoscopy;  Laterality: N/A;  . Renal angiogram N/A 09/18/2012    Procedure: RENAL ANGIOGRAM;  Surgeon: Serafina Mitchell, MD;  Location: [facility] CATH LAB;  Service: Cardiovascular;  Laterality: N/A;  . Renal angiogram N/A 10/17/2012    Procedure: RENAL ANGIOGRAM;  Surgeon: Serafina Mitchell, MD;  Location: [facility]  CATH LAB;  Service: Cardiovascular;  Laterality: N/A;  . Renal angiogram N/A 10/23/2012    Procedure: RENAL ANGIOGRAM;  Surgeon: Serafina Mitchell, MD;  Location: [facility] CATH LAB;  Service: Cardiovascular;  Laterality: N/A;  . Percutaneous stent intervention  10/23/2012    Procedure: PERCUTANEOUS STENT INTERVENTION;  Surgeon: Serafina Mitchell, MD;  Location: [facility] CATH LAB;  Service: Cardiovascular;;  . Av fistula placement Right 05/07/2015    Procedure: FIRST STAGE BASILIC VEIN TRANSPOSITION RIGHT UPPER ARM;  Surgeon: Serafina Mitchell, MD;  Location: [place] OR;  Service: Vascular;  Laterality: Right;  . Eye surgery Left      cataract surgery  . Bascilic vein transposition Right 08/13/2015    Procedure: RIGHT ARM SECOND STAGE BASILIC VEIN TRANSPOSITION;  Surgeon: Serafina Mitchell, MD;  Location: [place];  Service: Vascular;  Laterality: Right;    Prior to Admission medications   Medication Sig Start Date End Date Taking? Authorizing Provider  amLODipine (NORVASC) 10 MG tablet Take 10 mg by mouth daily.     Historical Provider, MD  aspirin EC 81 MG tablet Take 81 mg by mouth daily.    Historical Provider, MD  calcitRIOL (ROCALTROL) 0.25 MCG capsule Take 0.25 mcg by mouth daily.  04/16/15   Historical Provider, MD  clopidogrel (PLAVIX) 75 MG tablet TAKE ONE TABLET BY MOUTH ONCE DAILY Patient not taking: Reported on 10/05/2015 07/17/15   Serafina Mitchell, MD  dorzolamide-timolol (COSOPT) 22.3-6.8 MG/ML ophthalmic solution Place 1 drop into both eyes 2 (two) times daily.  04/16/15   Historical Provider, MD  ezetimibe (ZETIA) 10 MG tablet Take 10 mg by mouth daily.    Historical Provider, MD  levothyroxine (SYNTHROID, LEVOTHROID) 75 MCG tablet Take 75 mcg by mouth daily before breakfast.     Historical Provider, MD  metoprolol succinate (TOPROL-XL) 25 MG 24 hr tablet Take 25 mg by mouth daily.  08/29/12   Historical Provider, MD  oxyCODONE-acetaminophen (PERCOCET/ROXICET) 5-325 MG per tablet Take 1 tablet by mouth every 6 (six) hours as needed. Patient not taking: Reported on 10/05/2015 08/13/15   Gabriel Earing, PA-C    Social History:  reports that she quit smoking about 7 years ago. Her smoking use included Cigarettes. She smoked 0.50 packs per day. She has never used smokeless tobacco. She reports that she drinks alcohol. She reports that she does not use illicit drugs.  Family History  Problem Relation Age of Onset  . Heart disease Mother     After age 26  . Hypertension Mother   . Diabetes Mother   . Heart attack Mother   . Heart attack Father   . Diabetes Sister   . Hypertension Sister   . Diabetes Brother      Amputation  . Heart disease Brother     Heart Disease before age 68  . Hypertension Brother   . Heart attack Brother   . Peripheral vascular disease Brother     amputation    Review of Systems:  Constitutional: Denies fever, chills, diaphoresis, appetite change and fatigue.  HEENT: Denies photophobia, eye pain, redness, hearing loss, ear pain, congestion, sore throat, rhinorrhea, sneezing, mouth sores, trouble swallowing, neck pain, neck stiffness and tinnitus.   Respiratory: Denies SOB, DOE, cough, chest tightness,  and wheezing.   Cardiovascular: Denies chest pain, palpitations and leg swelling.  Gastrointestinal: Denies constipation, blood in stool and abdominal distention.  Genitourinary: Denies dysuria, urgency, frequency, hematuria, flank pain and difficulty urinating.  Endocrine: Denies: hot or  cold intolerance, sweats, changes in hair or nails, polyuria, polydipsia. Musculoskeletal: Denies myalgias, back pain, joint swelling, arthralgias and gait problem.  Skin: Denies pallor, rash and wound.  Neurological: Denies dizziness, seizures, syncope, weakness, light-headedness, numbness and headaches.  Hematological: Denies adenopathy. Easy bruising, personal or family bleeding history  Psychiatric/Behavioral: Denies suicidal ideation, mood changes, confusion, nervousness, sleep disturbance and agitation   Physical Exam: Blood pressure 115/74, pulse 80, temperature 98 F (36.7 C), temperature source Oral, resp. rate 18, height _0  (1.651 m), weight 54.885 kg (121 lb), SpO2 100 %. Gen.: Alert, awake, oriented 3 HEENT: Normocephalic, atraumatic, pupils equal round and reactive to light, extraocular movements intact, dry mucous membranes with cracked lips and tongue, poor skin turgor Neck: Supple, no JVD, no lymphadenopathy, no bruits, no goiter Cardiovascular: Regular rate and rhythm, no murmurs, rubs or gallops Lungs: Clear to auscultation bilaterally next line abdomen: Soft,  nontender, nondistended, positive bowel sounds Extremities: No clubbing, cyanosis or edema Neurologic: Grossly intact and nonfocal, had not ambulated her  Labs on Admission:  Results for orders placed or performed during the hospital encounter of 01/18/16 (from the past 48 hour(s))  CBC with Differential     Status: Abnormal   Collection Time: 01/18/16  1:53 AM  Result Value Ref Range   WBC 18.3 (H) 4.0 - 10.5 K/uL   RBC 3.39 (L) 3.87 - 5.11 MIL/uL   Hemoglobin 10.9 (L) 12.0 - 15.0 g/dL   HCT 32.4 (L) 36.0 - 46.0 %   MCV 95.6 78.0 - 100.0 fL   MCH 32.2 26.0 - 34.0 pg   MCHC 33.6 30.0 - 36.0 g/dL   RDW 15.0 11.5 - 15.5 %   Platelets 561 (H) 150 - 400 K/uL   Neutrophils Relative % 90 %   Lymphocytes Relative 5 %   Monocytes Relative 5 %   Eosinophils Relative 0 %   Basophils Relative 0 %   Neutro Abs 16.5 (H) 1.7 - 7.7 K/uL   Lymphs Abs 0.9 0.7 - 4.0 K/uL   Monocytes Absolute 0.9 0.1 - 1.0 K/uL   Eosinophils Absolute 0.0 0.0 - 0.7 K/uL   Basophils Absolute 0.0 0.0 - 0.1 K/uL  Comprehensive metabolic panel     Status: Abnormal   Collection Time: 01/18/16  1:53 AM  Result Value Ref Range   Sodium 138 135 - 145 mmol/L   Potassium 3.3 (L) 3.5 - 5.1 mmol/L   Chloride 100 (L) 101 - 111 mmol/L   CO2 18 (L) 22 - 32 mmol/L   Glucose, Bld 148 (H) 65 - 99 mg/dL   BUN 95 (H) 6 - 20 mg/dL   Creatinine, Ser 8.16 (H) 0.44 - 1.00 mg/dL   Calcium 8.9 8.9 - 10.3 mg/dL   Total Protein 8.2 (H) 6.5 - 8.1 g/dL   Albumin 3.2 (L) 3.5 - 5.0 g/dL   AST 13 (L) 15 - 41 U/L   ALT 12 (L) 14 - 54 U/L   Alkaline Phosphatase 85 38 - 126 U/L   Total Bilirubin 0.3 0.3 - 1.2 mg/dL   GFR calc non Af Amer 4 (L) >60 mL/min   GFR calc Af Amer 5 (L) >60 mL/min    Comment: (NOTE) The eGFR has been calculated using the CKD EPI equation. This calculation has not been validated in all clinical situations. eGFR's persistently <60 mL/min signify possible Chronic Kidney Disease.    Anion gap 20 (H) 5 - 15    Lipase, blood     Status: None  Collection Time: 01/18/16  1:53 AM  Result Value Ref Range   Lipase 21 11 - 51 U/L  I-Stat CG4 Lactic Acid, ED     Status: None   Collection Time: 01/18/16  2:45 AM  Result Value Ref Range   Lactic Acid, Venous 1.89 0.5 - 2.0 mmol/L  Urinalysis, Routine w reflex microscopic (not at [facility])     Status: Abnormal   Collection Time: 01/18/16  2:55 AM  Result Value Ref Range   Color, Urine YELLOW YELLOW   APPearance HAZY (A) CLEAR   Specific Gravity, Urine 1.010 1.005 - 1.030   pH 6.0 5.0 - 8.0   Glucose, UA 100 (A) NEGATIVE mg/dL   Hgb urine dipstick LARGE (A) NEGATIVE   Bilirubin Urine NEGATIVE NEGATIVE   Ketones, ur NEGATIVE NEGATIVE mg/dL   Protein, ur 100 (A) NEGATIVE mg/dL   Nitrite NEGATIVE NEGATIVE   Leukocytes, UA SMALL (A) NEGATIVE  Urine microscopic-add on     Status: Abnormal   Collection Time: 01/18/16  2:55 AM  Result Value Ref Range   Squamous Epithelial / LPF 0-5 (A) NONE SEEN   WBC, UA TOO NUMEROUS TO COUNT 0 - 5 WBC/hpf   RBC / HPF 6-30 0 - 5 RBC/hpf   Bacteria, UA MANY (A) NONE SEEN    Radiological Exams on Admission: Ct Abdomen Pelvis Wo Contrast  01/18/2016  CLINICAL DATA:  Sharp constant bilateral lower abdominal pain for 3 days. Diarrhea and weakness. EXAM: CT ABDOMEN AND PELVIS WITHOUT CONTRAST TECHNIQUE: Multidetector CT imaging of the abdomen and pelvis was performed following the standard protocol without IV contrast. COMPARISON:  None. FINDINGS: Atelectasis in the lung bases. Mild pericardial thickening. Coronary artery and aortic calcifications. Evaluation of solid organs and vascular structures is limited without IV contrast material. The unenhanced appearance of the liver, pancreas, spleen, adrenal glands, inferior vena cava, and retroperitoneal lymph nodes is unremarkable. Infrarenal abdominal aortic aneurysm measuring 4.5 cm AP dimension and 4.8 cm transverse dimension. Diffuse calcification of the abdominal aorta. Internal  calcifications within the abdominal aortic aneurysm may be due to dystrophic calcification or could indicate displaced intimal calcifications. Aortic dissection is not excluded. No retroperitoneal fluid collection to suggest rupture or hematoma. Two stones in the right renal pelvis, largest measuring 3 mm diameter. No hydronephrosis or hydroureter. No ureteral stones identified. Cholelithiasis without inflammatory change. Stomach, small bowel, and colon are not abnormally distended and contrast material flows through to the colon suggesting no evidence of bowel obstruction. No bowel wall thickening is appreciated. No free air or free fluid in the abdomen. Pelvis: Bladder wall is not thickened. Uterus is surgically absent. No pelvic mass or lymphadenopathy. No free or loculated pelvic fluid collections. 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 Degenerative changes in the spine. No destructive bone lesions. Probable calcific stenosis of the iliac and external iliac arteries bilaterally. IMPRESSION: Infrarenal abdominal aortic aneurysm measuring 4.5 cm maximal diameter. Possible displaced intimal calcification could indicate dissection. No retroperitoneal hematoma. Cholelithiasis. Nonobstructing right renal stones. Electronically Signed   By: Lucienne Capers M.D.   On: 01/18/2016 05:09    Assessment/Plan Active Problems:   AAA (abdominal aortic aneurysm) without rupture (H[facility])   Abdominal pain   Diarrhea   Nausea & vomiting   CKD (chronic kidney disease) stage 5, GFR less than 15 ml/min (HCC)   Leukocytosis   UTI (lower urinary tract infection)    UTI -Continue Cipro pending culture data.  Nausea, vomiting, diarrhea -Suspect acute viral gastroenteritis, this is already  starting to improve. -Treat supportively with IV fluids and anti-emetics.  AAA -No change in size, no signs of imminent rupture, although with abnormal CT scan [facility]  evaluation.  Acute on chronic kidney disease stage V -Per patient her baseline creatinine is around 5, she is followed by Dr. Elroy Channel. -Patient's creatinine on arrival was 8.16, with signs of metabolic acidosis with a bicarbonate of 18.  -I suspect her acute worsening is due to prerenal azotemia with volume depletion from GI losses and decreased oral intake. -Mindful of her chronic kidney disease state, will order saline at 75 mL an hour for 12 hours, volume status will need to be closely monitored. -Strongly suggest renal consultation upon arrival to [facility], she may very well need dialysis this admission. -She has a mature fistula in her right arm.  Leukocytosis -Likely secondary to acute viral illness and UTI, monitor trend.  Hypothyroidism -Check TSH, continue home dose of Synthroid  DVT prophylaxis -We'll only place on SCDs given abnormal AAA with possible signs of early dissection on CT scan.  CODE STATUS -Full code as discussed with patient and 2 sisters [facility] and [place] at bedside.    Time Spent on Admission: 95 minutes  HERNANDEZ ACOSTA,ESTELA [facility] Pager: [phone] 01/18/2016, 8:16 AM

## 2016-01-18 NOTE — Progress Notes (Signed)
Utilization review completed.  

## 2016-01-18 NOTE — ED Notes (Signed)
Pt finished contrast. CT notified 

## 2016-01-18 NOTE — ED Notes (Signed)
Pt c/o abdominal. Pt denied n/v/d to this nurse but reported dysuria.

## 2016-01-18 NOTE — ED Provider Notes (Signed)
TIME SEEN: 2:30 AM  CHIEF COMPLAINT: Nausea, vomiting and diarrhea  HPI: Pt is a 80 y.o. female with history of hypertension, hyperlipidemia, AAA, gallstones, chronic kidney disease with a left renal artery stent who presents to the emergency department with one day of lower abdominal pain that she is unable to describe without any radiation of pain and nausea, dry heaving and diarrhea. She denies to me any dysuria, hematuria, vaginal bleeding or discharge. Denies any chest pain or back pain. No sick contacts or recent travel. Was seen in the emergency department on February 8 for a headache. Had a head CT which was normal. She is not having a headache currently. States she thinks she has had a fever at home but has not measured it.  She is status post hysterectomy.  ROS: See HPI Constitutional: Subjective fever  Eyes: no drainage  ENT: no runny nose   Cardiovascular:  no chest pain  Resp: no SOB  GI: Vomiting and diarrhea GU: no dysuria Integumentary: no rash  Allergy: no hives  Musculoskeletal: no leg swelling  Neurological: no slurred speech ROS otherwise negative  PAST MEDICAL HISTORY/PAST SURGICAL HISTORY:  Past Medical History  Diagnosis Date  . Hypertension   . Hypothyroidism     thyroid nodule  . Hypercholesteremia   . Cataracts, bilateral   . Hx of adenomatous colonic polyps   . AAA (abdominal aortic aneurysm) ([place])   . Gallstones   . Glaucoma   . ANA positive   . Carotid artery occlusion   . Shortness of breath     "  at timesof exertion"  . Chronic kidney disease     left renal artery stent  . Arthritis     MEDICATIONS:  Prior to Admission medications   Medication Sig Start Date End Date Taking? Authorizing Provider  amLODipine (NORVASC) 10 MG tablet Take 10 mg by mouth daily.     Historical Provider, MD  aspirin EC 81 MG tablet Take 81 mg by mouth daily.    Historical Provider, MD  calcitRIOL (ROCALTROL) 0.25 MCG capsule Take 0.25 mcg by mouth daily.  04/16/15    Historical Provider, MD  clopidogrel (PLAVIX) 75 MG tablet TAKE ONE TABLET BY MOUTH ONCE DAILY Patient not taking: Reported on 10/05/2015 07/17/15   Serafina Mitchell, MD  dorzolamide-timolol (COSOPT) 22.3-6.8 MG/ML ophthalmic solution Place 1 drop into both eyes 2 (two) times daily.  04/16/15   Historical Provider, MD  ezetimibe (ZETIA) 10 MG tablet Take 10 mg by mouth daily.    Historical Provider, MD  levothyroxine (SYNTHROID, LEVOTHROID) 75 MCG tablet Take 75 mcg by mouth daily before breakfast.     Historical Provider, MD  metoprolol succinate (TOPROL-XL) 25 MG 24 hr tablet Take 25 mg by mouth daily.  08/29/12   Historical Provider, MD  oxyCODONE-acetaminophen (PERCOCET/ROXICET) 5-325 MG per tablet Take 1 tablet by mouth every 6 (six) hours as needed. Patient not taking: Reported on 10/05/2015 08/13/15   Hulen Shouts Rhyne, PA-C    ALLERGIES:  Allergies  Allergen Reactions  . Hydralazine Other (See Comments)    Liver inflamed  . Penicillins Hives and Itching    Has patient had a PCN reaction causing immediate rash, facial/tongue/throat swelling, SOB or lightheadedness with hypotension: Yes Has patient had a PCN reaction causing severe rash involving mucus membranes or skin necrosis: No Has patient had a PCN reaction that required hospitalization No Has patient had a PCN reaction occurring within the last 10 years: No If all of  the above answers are "NO", then may proceed with Cephalosporin use.      SOCIAL HISTORY:  Social History  Substance Use Topics  . Smoking status: Former Smoker -- 0.50 packs/day    Types: Cigarettes    Quit date: 12/05/2008  . Smokeless tobacco: Never Used  . Alcohol Use: 0.0 oz/week    0 Standard drinks or equivalent per week     Comment: rare glass of wine    FAMILY HISTORY: Family History  Problem Relation Age of Onset  . Heart disease Mother     After age 43  . Hypertension Mother   . Diabetes Mother   . Heart attack Mother   . Heart attack  Father   . Diabetes Sister   . Hypertension Sister   . Diabetes Brother     Amputation  . Heart disease Brother     Heart Disease before age 10  . Hypertension Brother   . Heart attack Brother   . Peripheral vascular disease Brother     amputation    EXAM: BP 130/63 mmHg  Pulse 92  Temp(Src) 98 F (36.7 C) (Oral)  Resp 20  Ht 5\' 5"  (1.651 m)  Wt 121 lb (54.885 kg)  BMI 20.14 kg/m2  SpO2 100% CONSTITUTIONAL: Alert and oriented and responds appropriately to questions. Elderly, dry heaving, afebrile, nontoxic HEAD: Normocephalic EYES: Conjunctivae clear, PERRL ENT: normal nose; no rhinorrhea; moist mucous membranes; pharynx without lesions noted NECK: Supple, no meningismus, no LAD  CARD: RRR; S1 and S2 appreciated; no murmurs, no clicks, no rubs, no gallops RESP: Normal chest excursion without splinting or tachypnea; breath sounds clear and equal bilaterally; no wheezes, no rhonchi, no rales, no hypoxia or respiratory distress, speaking full sentences ABD/GI: Normal bowel sounds; non-distended; soft, mildly tender throughout the lower abdomen, no rebound, no guarding, no peritoneal signs, no tenderness at McBurney's point, negative Murphy sign BACK:  The back appears normal and is non-tender to palpation, there is no CVA tenderness EXT: Normal ROM in all joints; non-tender to palpation; no edema; normal capillary refill; no cyanosis, no calf tenderness or swelling; AV fistula in the right upper extremity with good thrill, 2+ radial pulse on the right side, no tenderness over this fistula    SKIN: Normal color for age and race; warm NEURO: Moves all extremities equally, sensation to light touch intact diffusely, cranial nerves II through XII intact PSYCH: The patient's mood and manner are appropriate. Grooming and personal hygiene are appropriate.  MEDICAL DECISION MAKING: Patient here with nausea, vomiting and diarrhea. She is hemodynamically stable. Suspect this is viral illness.  She is complaining of lower abdominal pain that she is unable to describe that she has a benign abdominal exam. No hypotension. I have low suspicion that she has a ruptured AAA. Low suspicion for dissection. Will obtain a CT of her abdomen to evaluate for colitis, diverticulitis, bowel obstruction. Will treat symptomatically with IV fluids, Zofran. We'll obtain labs, stool cultures, urine.   ED PROGRESS: 3:30 AM  Pt's labs show leukocytosis with left shift. She has an elevated anion gap metabolic acidosis secondary to uremia and acute on chronic renal failure. She is followed by Dr. Arty Baumgartner. Reports that she sees him every 3 months and her most recent creatinine was 5. LFTs and lipase normal. She still making urine. She does have an AV fistula in the right upper extremity that has not yet been use. Lactate normal. We'll continue IV hydration as a suspect some of her  acute on chronic renal failure secondary to dehydration. On reevaluation she reports feeling much better after IV femoral and IV Zofran. She is aware that she will need to be admitted to the hospital. CT scan pending.   5:30 AM   CTAP shows no colitis, bowel obstruction, diverticulitis. She has cholelithiasis without other signs of cholecystitis. She has an infrarenal abdominal aortic aneurysm measuring 4.5 cm in AP dimension and 4.87 m in transverse dimension. There are internal  calcifications within the abdominal aortic aneurysm that may be due to dystrophic calcification it also could indicate displaced intimal calcification and therefore aortic dissection is not excluded.   Had lengthy discussion with patient and her family. They state that they would want to be seen by vascular surgery for further evaluation. She is unsure at this time if she would be interested in CT scan with contrast or surgery for her aneurysm (even if this was indicated) as she is aware that it could cause irreparable renal damage that could lead to dialysis. She  states that she would like to avoid dialysis at all costs. My suspicion that this is dissection is very low. Patient is aware however that this is a life-threatening process if it is present. At this time she does not want to proceed with any CT imaging here at [facility] with contrast. They would like transfer to [facility]. They state that they would like to be seen by vascular surgery and have a conversation with Dr. Trula Slade. Will consult Dr. Scot Dock on call for vascular surgery.   6:20 AM  Dw/ Dr. Scot Dock with vascular surgery. He has reviewed patient's imaging. He agrees that there is no change in the size of patient's aneurysm and no sign of rupture. He agrees with transfer to [facility] so that she can be evaluated by vascular surgery. He recommends medicine admission as it does appear that she likely has other issues causing her pain including gastroenteritis and a UTI. Urine culture pending. We'll give IV Cipro she reports a severe allergy to penicillins. It is unclear if she's ever had cephalosporins. Her blood pressure is currently 121/66. Vascular is comfortable with this blood pressure does not feel we need further management. She is not currently on any anticoagulation. She previously was on Plavix. Discussed with Dr. Darrick Meigs with hospitalist service for transfer to [facility]. Accepting physician at [facility] will be Dr. Blaine Hamper.  Will admit to telemetry. Updated patient and her family. They're comfortable with this plan. Will transfer by ambulance.  [place], DO 01/18/16 [phone]

## 2016-01-18 NOTE — ED Notes (Signed)
Family requesting ice chips for this pt. I advised pt and family that I would check with Dr. Leonides Schanz.

## 2016-01-18 NOTE — ED Notes (Signed)
Pt sleeping. 

## 2016-01-18 NOTE — ED Notes (Signed)
Pt has not had 1 episode of diarrhea or vomiting since being in the ED

## 2016-01-18 NOTE — ED Notes (Signed)
Attempted to call report at [phone]. Unable to take report at this time, they will call back as soon as possible.

## 2016-01-18 NOTE — Consult Note (Signed)
Vascular and Vein Specialist of [facility]  Patient name: Rebecca Tate MRN: [id number] DOB: 06-Jul-1936 Sex: female  REASON FOR CONSULT: Abdominal aortic aneurysm with abdominal pain.  HPI: Rebecca Tate is a 80 y.o. female, who is transferred from [facility]. She has a complicated medical history and is being followed with an abdominal aortic aneurysm by Dr. Trula Slade. Her last ultrasound in March 2016 showed that the aneurysm was 4.1 cm in maximum diameter. When Dr. Trula Slade saw her last he set her up for a 1 year follow up visit.   She presented with nausea, vomiting, diarrhea, abdominal pain. She tells me that the symptoms began gradually several days ago. Her symptoms have improved and currently she's having minimal abdominal pain. She denies any back pain. She was found to have a UTI reportedly at the referring hospital. Her white count was elevated and this prompted a CT scan which showed a 4. Live centimeter infrarenal abdominal aortic aneurysm. This was done without contrast. They mention some possibly displaced intimal calcium that could be concerning for dissection although this would be difficult to determine on a noncontrast CT. Regardless that was no evidence of retroperitoneal hematoma or rupture. The patient was transferred here for vascular evaluation. She was admitted by the medical service.  She does have a history of stage V chronic kidney disease.  She is had a previous left renal artery stent. She is also had a basilic vein transposition.  Past Medical History  Diagnosis Date  . Hypertension   . Hypothyroidism     thyroid nodule  . Hypercholesteremia   . Cataracts, bilateral   . Hx of adenomatous colonic polyps   . AAA (abdominal aortic aneurysm) ([place])   . Gallstones   . Glaucoma   . ANA positive   . Carotid artery occlusion   . Shortness of breath     "  at timesof exertion"  . Chronic kidney disease     left renal artery stent  . Arthritis      Family History  Problem Relation Age of Onset  . Heart disease Mother     After age 52  . Hypertension Mother   . Diabetes Mother   . Heart attack Mother   . Heart attack Father   . Diabetes Sister   . Hypertension Sister   . Diabetes Brother     Amputation  . Heart disease Brother     Heart Disease before age 60  . Hypertension Brother   . Heart attack Brother   . Peripheral vascular disease Brother     amputation    SOCIAL HISTORY: Social History   Social History  . Marital Status: Single    Spouse Name: N/A  . Number of Children: N/A  . Years of Education: N/A   Occupational History  . Not on file.   Social History Main Topics  . Smoking status: Former Smoker -- 0.50 packs/day    Types: Cigarettes    Quit date: 12/05/2008  . Smokeless tobacco: Never Used  . Alcohol Use: 0.0 oz/week    0 Standard drinks or equivalent per week     Comment: rare glass of wine  . Drug Use: No  . Sexual Activity: Not Currently   Other Topics Concern  . Not on file   Social History Narrative    Allergies  Allergen Reactions  . Hydralazine Other (See Comments)    Liver inflamed  . Penicillins Hives and Itching  Has patient had a PCN reaction causing immediate rash, facial/tongue/throat swelling, SOB or lightheadedness with hypotension: Yes Has patient had a PCN reaction causing severe rash involving mucus membranes or skin necrosis: No Has patient had a PCN reaction that required hospitalization No Has patient had a PCN reaction occurring within the last 10 years: No If all of the above answers are "NO", then may proceed with Cephalosporin use.      Current Facility-Administered Medications  Medication Dose Route Frequency Provider Last Rate Last Dose  . 0.9 %  sodium chloride infusion   Intravenous STAT Kristen N Ward, DO      . 0.9 %  sodium chloride infusion   Intravenous Continuous Estela Leonie Green, MD      . acetaminophen (TYLENOL) tablet 650 mg   650 mg Oral Q6H PRN Erline Hau, MD       Or  . acetaminophen (TYLENOL) suppository 650 mg  650 mg Rectal Q6H PRN Erline Hau, MD      . amLODipine (NORVASC) tablet 10 mg  10 mg Oral Daily Erline Hau, MD      . aspirin EC tablet 81 mg  81 mg Oral Daily Erline Hau, MD      . calcitRIOL (ROCALTROL) capsule 0.25 mcg  0.25 mcg Oral Daily Erline Hau, MD      . ciprofloxacin (CIPRO) IVPB 400 mg  400 mg Intravenous Q12H Erline Hau, MD      . ezetimibe (ZETIA) tablet 10 mg  10 mg Oral Daily Erline Hau, MD      . levothyroxine (SYNTHROID, LEVOTHROID) tablet 75 mcg  75 mcg Oral QAC breakfast Erline Hau, MD      . metoprolol succinate (TOPROL-XL) 24 hr tablet 25 mg  25 mg Oral Daily Estela Leonie Green, MD      . ondansetron [facility]) 4 MG/2ML injection           . ondansetron (ZOFRAN) tablet 4 mg  4 mg Oral Q6H PRN Erline Hau, MD       Or  . ondansetron [facility]) injection 4 mg  4 mg Intravenous Q6H PRN Erline Hau, MD      . senna-docusate (Senokot-S) tablet 1 tablet  1 tablet Oral QHS PRN Erline Hau, MD      . sodium chloride flush (NS) 0.9 % injection 3 mL  3 mL Intravenous Q12H Estela Leonie Green, MD        REVIEW OF SYSTEMS:  [X]  denotes positive finding, [ ]  denotes negative finding Cardiac  Comments:  Chest pain or chest pressure:    Shortness of breath upon exertion:    Short of breath when lying flat:    Irregular heart rhythm:        Vascular    Pain in calf, thigh, or hip brought on by ambulation:    Pain in feet at night that wakes you up from your sleep:     Blood clot in your veins:    Leg swelling:         Pulmonary    Oxygen at home:    Productive cough:     Wheezing:         Neurologic    Sudden weakness in arms or legs:     Sudden numbness in arms or legs:     Sudden onset of difficulty speaking or  slurred speech:    Temporary loss of vision in one eye:     Problems with dizziness:         Gastrointestinal    Blood in stool:     Vomited blood:         Genitourinary    Burning when urinating:  X   Blood in urine:        Psychiatric    Major depression:         Hematologic    Bleeding problems:    Problems with blood clotting too easily:        Skin    Rashes or ulcers:        Constitutional    Fever or chills:      PHYSICAL EXAM: Filed Vitals:   01/18/16 0730 01/18/16 0804 01/18/16 0805 01/18/16 0920  BP:  115/74 115/74 100/63  Pulse:  80 80 84  Temp:  98 F (36.7 C) 98 F (36.7 C) 98 F (36.7 C)  TempSrc:    Oral  Resp:  18 18 20   Height:      Weight:      SpO2: 100% 100% 100% 97%    GENERAL: The patient is a well-nourished female, in no acute distress. The vital signs are documented above. CARDIAC: There is a regular rate and rhythm.  VASCULAR: she has bilateral carotid bruits. He has palpable femoral pulses. She has palpable pedal pulses on the left. I cannot palpate pedal pulses on the right although both feet are warm and well-perfused. PULMONARY: There is good air exchange bilaterally without wheezing or rales. ABDOMEN: Soft and non-tender with normal pitched bowel sounds. Her aneurysm is nontender. MUSCULOSKELETAL: There are no major deformities or cyanosis. NEUROLOGIC: No focal weakness or paresthesias are detected. SKIN: There are no ulcers or rashes noted. PSYCHIATRIC: The patient has a normal affect.  DATA:  I have reviewed her CT scan that was done without contrast. This shows a 4.7 cm infrarenal abdominal aortic aneurysm I my measurement.. There is no evidence of rupture or retroperitoneal hematoma.  MEDICAL ISSUES:  4.7 CM INFRARENAL ABDOMINAL AORTIC ANEURYSM: There is no evidence of retroperitoneal hematoma or rupture. The aneurysm is fairly small. I would not recommend a contrast CT as this would likely put her on dialysis. Her belly pain  has improved and I would agree that this could be related to her UTI or to a gastroenteritis. She is scheduled for continued follow up with Dr. Trula Slade. We will follow during this admission.   Deitra Mayo [facility] Specialists of [place]: [phone]

## 2016-01-18 NOTE — ED Notes (Signed)
Pt c/o abdominal pain x 2 days and vomiting today

## 2016-01-18 NOTE — ED Notes (Signed)
PT transported to [facility] at this time via [place].

## 2016-01-19 DIAGNOSIS — E872 Acidosis: Secondary | ICD-10-CM

## 2016-01-19 LAB — BASIC METABOLIC PANEL
Anion gap: 17 — ABNORMAL HIGH (ref 5–15)
BUN: 78 mg/dL — ABNORMAL HIGH (ref 6–20)
CO2: 16 mmol/L — ABNORMAL LOW (ref 22–32)
Calcium: 8.5 mg/dL — ABNORMAL LOW (ref 8.9–10.3)
Chloride: 107 mmol/L (ref 101–111)
Creatinine, Ser: 7.23 mg/dL — ABNORMAL HIGH (ref 0.44–1.00)
GFR calc Af Amer: 6 mL/min — ABNORMAL LOW (ref >60)
GFR calc non Af Amer: 5 mL/min — ABNORMAL LOW (ref >60)
Glucose, Bld: 104 mg/dL — ABNORMAL HIGH (ref 65–99)
Potassium: 4.3 mmol/L (ref 3.5–5.1)
Sodium: 140 mmol/L (ref 135–145)

## 2016-01-19 LAB — CBC
HCT: 29.4 % — ABNORMAL LOW (ref 36.0–46.0)
Hemoglobin: 9.8 g/dL — ABNORMAL LOW (ref 12.0–15.0)
MCH: 31.9 pg (ref 26.0–34.0)
MCHC: 33.3 g/dL (ref 30.0–36.0)
MCV: 95.8 fL (ref 78.0–100.0)
Platelets: 518 10*3/uL — ABNORMAL HIGH (ref 150–400)
RBC: 3.07 MIL/uL — ABNORMAL LOW (ref 3.87–5.11)
RDW: 15.3 % (ref 11.5–15.5)
WBC: 21.1 10*3/uL — ABNORMAL HIGH (ref 4.0–10.5)

## 2016-01-19 MED ORDER — SODIUM BICARBONATE 650 MG PO TABS
650.0000 mg | ORAL_TABLET | Freq: Two times a day (BID) | ORAL | Status: DC
  Administered 2016-01-19 – 2016-01-22 (×6): 650 mg via ORAL
  Filled 2016-01-19 (×6): qty 1

## 2016-01-19 MED ORDER — CIPROFLOXACIN IN D5W 400 MG/200ML IV SOLN
400.0000 mg | INTRAVENOUS | Status: DC
  Administered 2016-01-20 – 2016-01-23 (×4): 400 mg via INTRAVENOUS
  Filled 2016-01-19 (×5): qty 200

## 2016-01-19 MED ORDER — TRAMADOL HCL 50 MG PO TABS: 50.0000 mg | ORAL_TABLET | Freq: Four times a day (QID) | ORAL | Status: DC | PRN

## 2016-01-19 MED ORDER — TRAMADOL HCL 50 MG PO TABS
50.0000 mg | ORAL_TABLET | Freq: Two times a day (BID) | ORAL | Status: DC | PRN
  Administered 2016-01-21 – 2016-01-23 (×2): 100 mg via ORAL
  Filled 2016-01-19 (×2): qty 2

## 2016-01-19 NOTE — Progress Notes (Signed)
Paged K. Sofia PA  in regards to verifying whether pt can get labs drawn from left arm, which has restricted arm band. Was notified that it is ok to draw labs from left arm. Will post a sign over the bed. Pt is resting. VSS. No new complaints. Will continue to monitor.   Janee Ureste, RN

## 2016-01-19 NOTE — Progress Notes (Signed)
PROGRESS NOTE  Rebecca Tate [id number] DOB: [birth year] DOA: 01/18/2016 PCP: Kandice Hams, MD  HPI/Recap of past 24 hours:  Reported feeling better, lower abdominal pain has almost resolved, some dry heave, but no vomiting,still no appetite. Reported good urine output Multiple family members in room  Assessment/Plan: Active Problems:   AAA (abdominal aortic aneurysm) without rupture (HCC)   Abdominal pain   Diarrhea   Nausea & vomiting   CKD (chronic kidney disease) stage 5, GFR less than 15 ml/min (HCC)   Leukocytosis   UTI (lower urinary tract infection)  UTI -Continue Cipro pending culture +ecoli, sensitivity pending  Nausea, vomiting, diarrhea -Suspect acute viral gastroenteritis, this is already starting to improve. -Treat supportively with IV fluids and anti-emetics.   Acute on chronic kidney disease stage V -Per patient her baseline creatinine is around 5, she is followed by Dr. Elroy Channel. -Patient's creatinine on arrival was 8.16, with signs of metabolic acidosis with a bicarbonate of 18.  - acute worsening is due to prerenal azotemia with volume depletion from GI losses and decreased oral intake. And UTI -s/p gentle hydration  -renal function improving, good urination, lyte ok, will start oral bicarb supplement  -She has a mature fistula in her right arm.  Leukocytosis -Likely secondary to acute viral illness, dehydration and UTI, monitor trend.  AAA -No change in size, no signs of imminent rupture, although with abnormal CT scan patient will be [facility] to [facility] for vascular surgery evaluation. -no surgical intervention per vascular surgery    Hypothyroidism - TSH 1.6, continue home dose of Synthroid  DVT prophylaxis -We'll only place on SCDs given abnormal AAA with possible signs of early dissection on CT scan.  CODE STATUS -Full code, admitting MD discussed code status with patient and 2 sisters Stanton Kidney and [place] at  bedside.   Family Communication: patient and multiple family members in room  Disposition Plan: home in 1-2 days   Consultants:  Vascular surgery  Procedures:  none  Antibiotics:  cipro   Objective: BP 104/58 mmHg  Pulse 81  Temp(Src) 99.1 F (37.3 C) (Oral)  Resp 19  Ht 5\' 5"  (1.651 m)  Wt 54.885 kg (121 lb)  BMI 20.14 kg/m2  SpO2 100%  Intake/Output Summary (Last 24 hours) at 01/19/16 1828 Last data filed at 01/19/16 0835  Gross per 24 hour  Intake    240 ml  Output      0 ml  Net    240 ml   Filed Weights   01/18/16 0138  Weight: 54.885 kg (121 lb)    Exam:   General:  NAD  Cardiovascular: RRR  Respiratory: CTABL  Abdomen: minimal tender suprapubic, otherwise unremarkable, positive BS  Musculoskeletal: No Edema  Neuro: aaox3  Data Reviewed: Basic Metabolic Panel:  Recent Labs Lab 01/18/16 0153 01/19/16 1100  NA 138 140  K 3.3* 4.3  CL 100* 107  CO2 18* 16*  GLUCOSE 148* 104*  BUN 95* 78*  CREATININE 8.16* 7.23*  CALCIUM 8.9 8.5*   Liver Function Tests:  Recent Labs Lab 01/18/16 0153  AST 13*  ALT 12*  ALKPHOS 85  BILITOT 0.3  PROT 8.2*  ALBUMIN 3.2*    Recent Labs Lab 01/18/16 0153  LIPASE 21   No results for input(s): AMMONIA in the last 168 hours. CBC:  Recent Labs Lab 01/18/16 0153 01/19/16 1100  WBC 18.3* 21.1*  NEUTROABS 16.5*  --   HGB 10.9* 9.8*  HCT 32.4* 29.4*  MCV 95.6 95.8  PLT 561* 518*   Cardiac Enzymes:   No results for input(s): CKTOTAL, CKMB, CKMBINDEX, TROPONINI in the last 168 hours. BNP (last 3 results) No results for input(s): BNP in the last 8760 hours.  ProBNP (last 3 results) No results for input(s): PROBNP in the last 8760 hours.  CBG: No results for input(s): GLUCAP in the last 168 hours.  Recent Results (from the past 240 hour(s))  Urine culture     Status: None (Preliminary result)   Collection Time: 01/18/16  2:55 AM  Result Value Ref Range Status   Specimen  Description URINE, CLEAN CATCH  Final   Special Requests NONE  Final   Culture   Final    >=100,000 COLONIES/mL ESCHERICHIA COLI Performed at [facility]    Report Status PENDING  Incomplete  Urine culture     Status: None (Preliminary result)   Collection Time: 01/18/16 10:55 PM  Result Value Ref Range Status   Specimen Description URINE, CLEAN CATCH  Final   Special Requests NONE  Final   Culture NO GROWTH < 24 HOURS  Final   Report Status PENDING  Incomplete     Studies: No results found.  Scheduled Meds: . amLODipine  10 mg Oral Daily  . aspirin EC  81 mg Oral Daily  . calcitRIOL  0.25 mcg Oral Daily  . [START ON 01/20/2016] ciprofloxacin  400 mg Intravenous Q24H  . ezetimibe  10 mg Oral Daily  . feeding supplement (NEPRO CARB STEADY)  237 mL Oral BID BM  . levothyroxine  75 mcg Oral QAC breakfast  . metoprolol succinate  25 mg Oral Daily  . sodium chloride flush  3 mL Intravenous Q12H    Continuous Infusions:    Time spent: 11mins  Teyonna Plaisted MD, PhD  [facility] Pager [phone]. If 7PM-7AM, please contact night-coverage at [email or web address], password  [facility] 01/19/2016, 6:28 PM  LOS: 1 day

## 2016-01-20 ENCOUNTER — Inpatient Hospital Stay (HOSPITAL_COMMUNITY): Payer: Medicare Other

## 2016-01-20 DIAGNOSIS — D72829 Elevated white blood cell count, unspecified: Secondary | ICD-10-CM

## 2016-01-20 DIAGNOSIS — N185 Chronic kidney disease, stage 5: Secondary | ICD-10-CM

## 2016-01-20 LAB — BASIC METABOLIC PANEL WITH GFR
Anion gap: 16 — ABNORMAL HIGH (ref 5–15)
BUN: 76 mg/dL — ABNORMAL HIGH (ref 6–20)
CO2: 18 mmol/L — ABNORMAL LOW (ref 22–32)
Calcium: 8.7 mg/dL — ABNORMAL LOW (ref 8.9–10.3)
Chloride: 105 mmol/L (ref 101–111)
Creatinine, Ser: 6.93 mg/dL — ABNORMAL HIGH (ref 0.44–1.00)
GFR calc Af Amer: 6 mL/min — ABNORMAL LOW
GFR calc non Af Amer: 5 mL/min — ABNORMAL LOW
Glucose, Bld: 99 mg/dL (ref 65–99)
Potassium: 3.5 mmol/L (ref 3.5–5.1)
Sodium: 139 mmol/L (ref 135–145)

## 2016-01-20 LAB — URINE CULTURE: Culture: >=100000

## 2016-01-20 LAB — CBC
HCT: 30.3 % — ABNORMAL LOW (ref 36.0–46.0)
HEMOGLOBIN: 9.8 g/dL — AB (ref 12.0–15.0)
MCH: 30.4 pg (ref 26.0–34.0)
MCHC: 32.3 g/dL (ref 30.0–36.0)
MCV: 94.1 fL (ref 78.0–100.0)
Platelets: 646 10*3/uL — ABNORMAL HIGH (ref 150–400)
RBC: 3.22 MIL/uL — AB (ref 3.87–5.11)
RDW: 14.9 % (ref 11.5–15.5)
WBC: 19.7 10*3/uL — AB (ref 4.0–10.5)

## 2016-01-20 MED ORDER — MORPHINE SULFATE (PF) 2 MG/ML IV SOLN: 1.0000 mg | INTRAVENOUS | Status: DC | PRN

## 2016-01-20 MED ORDER — BOOST / RESOURCE BREEZE PO LIQD
1.0000 | Freq: Three times a day (TID) | ORAL | Status: DC
  Administered 2016-01-21 – 2016-01-23 (×6): 1 via ORAL

## 2016-01-20 MED ORDER — SODIUM BICARBONATE 8.4 % IV SOLN
INTRAVENOUS | Status: DC
  Administered 2016-01-20 – 2016-01-21 (×2): via INTRAVENOUS
  Filled 2016-01-20 (×3): qty 150

## 2016-01-20 MED ORDER — SORBITOL 70 % SOLN
30.0000 mL | Freq: Every day | Status: DC | PRN
  Administered 2016-01-21: 30 mL via ORAL
  Filled 2016-01-20 (×2): qty 30

## 2016-01-20 MED ORDER — FENTANYL CITRATE (PF) 100 MCG/2ML IJ SOLN
12.5000 ug | INTRAMUSCULAR | Status: DC | PRN
  Administered 2016-01-20 – 2016-01-21 (×2): 12.5 ug via INTRAVENOUS
  Filled 2016-01-20 (×2): qty 2

## 2016-01-20 NOTE — Progress Notes (Signed)
VASCULAR SURGERY:  I have arranged a follow up ultrasound with Dr. Trula Slade in 3 months to continue to follow her aneurysm. We will be available as needed.  Deitra Mayo, MD, [place] [phone] Office: [phone]

## 2016-01-20 NOTE — Progress Notes (Signed)
Initial Nutrition Assessment  DOCUMENTATION CODES:   Not applicable  INTERVENTION:   Boost Breeze po TID, each supplement provides 250 kcal and 9 grams of protein  NUTRITION DIAGNOSIS:   Inadequate oral intake related to nausea, vomiting as evidenced by per patient/family report  GOAL:   Patient will meet greater than or equal to 90% of their needs  MONITOR:   PO intake, Supplement acceptance, Labs, Weight trends, I & O's  REASON FOR ASSESSMENT:   Malnutrition Screening Tool  ASSESSMENT:   80 yo Female with multiple medical comorbidities including stage V CKD, hypothyroidism, AAA, HTN, hyperlipidemia, left renal artery stenosis status post stenting who presented to the hospital with a 2 day history of lower abdominal pain with nausea and diarrhea. She has not had any further episodes of emesis since this morning. Upon arrival to the ED she was found to have a UTI, labs are significant for a worsening of her chronic kidney disease with a creatinine of 8.16, bicarbonate of 18, potassium 3.3, WBC count of 18.3.  Patient with hand over abdomen, c/o pain upon RD visit.   States she is not eating and everything is "coming back up". Internal Medicine suspecting acute viral gastroenteritis. Will order clear liquid supplement (ie Boost Breeze) to see if she tolerates.  RD unable to complete Nutrition Focused Physical Exam at this time.  Diet Order:  Diet renal with fluid restriction Fluid restriction:: 1200 mL Fluid; Room service appropriate?: Yes; Fluid consistency:: Thin  Skin:  Reviewed, no issues  Last BM:  2/12  Height:   Ht Readings from Last 1 Encounters:  01/18/16 5\' 5"  (1.651 m)    Weight:   Wt Readings from Last 1 Encounters:  01/20/16 119 lb (53.978 kg)    Ideal Body Weight:  56.8 kg  BMI:  Body mass index is 19.8 kg/(m^2).  Estimated Nutritional Needs:   Kcal:  1400-1600  Protein:  65-75 gm  Fluid:  >/= 1.5 L  EDUCATION NEEDS:   No education  needs identified at this time  Arthur Holms, RD, LDN Pager #: [phone] After-Hours Pager #: [phone]

## 2016-01-20 NOTE — Consult Note (Signed)
Patient is a 84 H old woman with multiple medical comorbidities including stage V chronic kidney disease (creat on 11/11/15 was 4.57, 01/15/16 was 7.5. s/p RUE BVT Sept 2016), hypothyroidism, AAA, hypertension, hyperlipidemia, left renal artery stenosis status post stenting who presented to the hospital with a 2 day history of lower abdominal pain with nausea and diarrhea.  Upon arrival to the emergency department she was found to have  a creatinine of 8.16, bicarbonate of 18, potassium 3.3, WBC count of 18.3. Because of her history of a AAA CT scan was obtained, with no contrast and no evidence of dissection.  She has had persistent nausea and occ vomiting despite an improvement in renal fct.  Creat was 6.93 on 2/15. She does admit to fatigue, anorexia and malaise for several months. UA revealed microhematuria and TNTC WBCs.   Past Medical History  Diagnosis Date  . Hypertension   . Hypothyroidism     thyroid nodule  . Hypercholesteremia   . Cataracts, bilateral   . Hx of adenomatous colonic polyps   . AAA (abdominal aortic aneurysm) ([place])   . Gallstones   . Glaucoma   . ANA positive   . Carotid artery occlusion   . Shortness of breath     "  at timesof exertion"  . Chronic kidney disease     left renal artery stent  . Arthritis    Past Surgical History  Procedure Laterality Date  . Appendectomy    . Colonoscopy    . Eus  06/20/2012    Procedure: ESOPHAGEAL ENDOSCOPIC ULTRASOUND (EUS) RADIAL;  Surgeon: Arta Silence, MD;  Location: [facility] ENDOSCOPY;  Service: Endoscopy;  Laterality: N/A;  . Abdominal hysterectomy  1975    partial  . Renal artery stent Left     November, 2013, Dr. Trula Slade  . Colonoscopy with propofol N/A 09/03/2013    Procedure: COLONOSCOPY WITH PROPOFOL;  Surgeon: Garlan Fair, MD;  Location: [facility] ENDOSCOPY;  Service: Endoscopy;  Laterality: N/A;  . Renal angiogram N/A 09/18/2012    Procedure: RENAL ANGIOGRAM;  Surgeon: Serafina Mitchell, MD;  Location: [facility] CATH LAB;   Service: Cardiovascular;  Laterality: N/A;  . Renal angiogram N/A 10/17/2012    Procedure: RENAL ANGIOGRAM;  Surgeon: Serafina Mitchell, MD;  Location: [facility] CATH LAB;  Service: Cardiovascular;  Laterality: N/A;  . Renal angiogram N/A 10/23/2012    Procedure: RENAL ANGIOGRAM;  Surgeon: Serafina Mitchell, MD;  Location: [facility] CATH LAB;  Service: Cardiovascular;  Laterality: N/A;  . Percutaneous stent intervention  10/23/2012    Procedure: PERCUTANEOUS STENT INTERVENTION;  Surgeon: Serafina Mitchell, MD;  Location: [facility] CATH LAB;  Service: Cardiovascular;;  . Av fistula placement Right 05/07/2015    Procedure: FIRST STAGE BASILIC VEIN TRANSPOSITION RIGHT UPPER ARM;  Surgeon: Serafina Mitchell, MD;  Location: [place] OR;  Service: Vascular;  Laterality: Right;  . Eye surgery Left     cataract surgery  . Bascilic vein transposition Right 08/13/2015    Procedure: RIGHT ARM SECOND STAGE BASILIC VEIN TRANSPOSITION;  Surgeon: Serafina Mitchell, MD;  Location: [place] OR;  Service: Vascular;  Laterality: Right;   Social History:  reports that she quit smoking about 7 years ago. Her smoking use included Cigarettes. She smoked 0.50 packs per day. She has never used smokeless tobacco. She reports that she drinks alcohol. She reports that she does not use illicit drugs. Allergies:  Allergies  Allergen Reactions  . Hydralazine Other (See Comments)    Liver inflamed  .  Penicillins Hives and Itching    Has patient had a PCN reaction causing immediate rash, facial/tongue/throat swelling, SOB or lightheadedness with hypotension: Yes Has patient had a PCN reaction causing severe rash involving mucus membranes or skin necrosis: No Has patient had a PCN reaction that required hospitalization No Has patient had a PCN reaction occurring within the last 10 years: No If all of the above answers are "NO", then may proceed with Cephalosporin use.     Family History  Problem Relation Age of Onset  . Heart disease Mother     After age 28  .  Hypertension Mother   . Diabetes Mother   . Heart attack Mother   . Heart attack Father   . Diabetes Sister   . Hypertension Sister   . Diabetes Brother     Amputation  . Heart disease Brother     Heart Disease before age 23  . Hypertension Brother   . Heart attack Brother   . Peripheral vascular disease Brother     amputation    Medications:  Scheduled: . aspirin EC  81 mg Oral Daily  . calcitRIOL  0.25 mcg Oral Daily  . ciprofloxacin  400 mg Intravenous Q24H  . ezetimibe  10 mg Oral Daily  . feeding supplement  1 Container Oral TID BM  . levothyroxine  75 mcg Oral QAC breakfast  . metoprolol succinate  25 mg Oral Daily  . sodium bicarbonate  650 mg Oral BID  . sodium chloride flush  3 mL Intravenous Q12H   ROS: aas per HPI  Blood pressure 108/61, pulse 82, temperature 98.7 F (37.1 C), temperature source Oral, resp. rate 19, height _0  (1.651 m), weight 53.978 kg (119 lb), SpO2 100 %.  General appearance: alert and cooperative Head: Normocephalic, without obvious abnormality, atraumatic Eyes: negative Ears: normal TM's and external ear canals both ears Nose: Nares normal. Septum midline. Mucosa normal. No drainage or sinus tenderness. Resp: clear to auscultation bilaterally Chest wall: no tenderness Cardio: regular rate and rhythm, S1, S2 normal, no murmur, click, rub or gallop GI: soft, non-tender; bowel sounds normal; no masses,  no organomegaly Extremities: edema tr edema BVT RUE Neurologic: Grossly normal  No asterixis Results for orders placed or performed during the hospital encounter of 01/18/16 (from the past 48 hour(s))  Urine culture     Status: None   Collection Time: 01/18/16 10:55 PM  Result Value Ref Range   Specimen Description URINE, CLEAN CATCH    Special Requests NONE    Culture MULTIPLE SPECIES PRESENT, SUGGEST RECOLLECTION    Report Status 01/20/2016 FINAL   Basic metabolic panel     Status: Abnormal   Collection Time: 01/19/16 11:00 AM   Result Value Ref Range   Sodium 140 135 - 145 mmol/L   Potassium 4.3 3.5 - 5.1 mmol/L   Chloride 107 101 - 111 mmol/L   CO2 16 (L) 22 - 32 mmol/L   Glucose, Bld 104 (H) 65 - 99 mg/dL   BUN 78 (H) 6 - 20 mg/dL   Creatinine, Ser 7.23 (H) 0.44 - 1.00 mg/dL   Calcium 8.5 (L) 8.9 - 10.3 mg/dL   GFR calc non Af Amer 5 (L) >60 mL/min   GFR calc Af Amer 6 (L) >60 mL/min    Comment: (NOTE) The eGFR has been calculated using the CKD EPI equation. This calculation has not been validated in all clinical situations. eGFR's persistently <60 mL/min signify possible Chronic Kidney Disease.  Anion gap 17 (H) 5 - 15  CBC     Status: Abnormal   Collection Time: 01/19/16 11:00 AM  Result Value Ref Range   WBC 21.1 (H) 4.0 - 10.5 K/uL   RBC 3.07 (L) 3.87 - 5.11 MIL/uL   Hemoglobin 9.8 (L) 12.0 - 15.0 g/dL   HCT 29.4 (L) 36.0 - 46.0 %   MCV 95.8 78.0 - 100.0 fL   MCH 31.9 26.0 - 34.0 pg   MCHC 33.3 30.0 - 36.0 g/dL   RDW 15.3 11.5 - 15.5 %   Platelets 518 (H) 150 - 400 K/uL  CBC     Status: Abnormal   Collection Time: 01/20/16  4:20 AM  Result Value Ref Range   WBC 19.7 (H) 4.0 - 10.5 K/uL   RBC 3.22 (L) 3.87 - 5.11 MIL/uL   Hemoglobin 9.8 (L) 12.0 - 15.0 g/dL   HCT 30.3 (L) 36.0 - 46.0 %   MCV 94.1 78.0 - 100.0 fL   MCH 30.4 26.0 - 34.0 pg   MCHC 32.3 30.0 - 36.0 g/dL   RDW 14.9 11.5 - 15.5 %   Platelets 646 (H) 150 - 400 K/uL  Basic metabolic panel     Status: Abnormal   Collection Time: 01/20/16  4:20 AM  Result Value Ref Range   Sodium 139 135 - 145 mmol/L   Potassium 3.5 3.5 - 5.1 mmol/L    Comment: DELTA CHECK NOTED   Chloride 105 101 - 111 mmol/L   CO2 18 (L) 22 - 32 mmol/L   Glucose, Bld 99 65 - 99 mg/dL   BUN 76 (H) 6 - 20 mg/dL   Creatinine, Ser 6.93 (H) 0.44 - 1.00 mg/dL   Calcium 8.7 (L) 8.9 - 10.3 mg/dL   GFR calc non Af Amer 5 (L) >60 mL/min   GFR calc Af Amer 6 (L) >60 mL/min    Comment: (NOTE) The eGFR has been calculated using the CKD EPI equation. This  calculation has not been validated in all clinical situations. eGFR's persistently <60 mL/min signify possible Chronic Kidney Disease.    Anion gap 16 (H) 5 - 15   Dg Abd 1 View  01/20/2016  CLINICAL DATA:  Lower abdominal pain EXAM: ABDOMEN - 1 VIEW COMPARISON:  CT scan 01/18/2016 FINDINGS: There is normal small bowel gas pattern. Contrast material from recent CT scan noted within transverse colon and descending colon. Scattered colonic diverticula are noted within transverse colon and descending colon. Some colonic gas noted in transverse colon. Left renal artery stent is noted. IMPRESSION: Normal small bowel gas pattern. Residual contrast material noted within transverse colon and distal colon. Colonic diverticula are noted within transverse colon and descending colon. Some colonic gas noted in transverse colon. Electronically Signed   By: Lahoma Crocker M.D.   On: 01/20/2016 12:45   US Renal  01/20/2016  CLINICAL DATA:  Renal failure. EXAM: RENAL / URINARY TRACT ULTRASOUND COMPLETE COMPARISON:  CT, 01/18/2016 and ultrasound, 03/14/2012. FINDINGS: Right Kidney: Length: 8.7 cm. Increased parenchymal echogenicity with cortical thinning. No mass. No hydronephrosis. Left Kidney: Length: 8.6 cm. Increased parenchymal echogenicity. 14 mm upper pole cyst. No other masses. No hydronephrosis. Bladder: Appears normal for degree of bladder distention. IMPRESSION: 1. Findings of medical renal disease with increased parenchymal echogenicity, small renal sizes and cortical thinning. 2. Left upper pole renal cysts which was present on the prior ultrasound. 3. Left kidney has mildly decreased in size the prior ultrasound. No other change. 4. No hydronephrosis.  Electronically Signed   By: Lajean Manes M.D.   On: 01/20/2016 15:49    Assessment:  1 Stage V CKD with AKI hemodynamically mediated 2 BVT RUE, a little small 3 E. Coli UTI Plan: 1 Supportive treatment.   2 She could start dialysis but is less inclined to  begin at this time which should be OK if symptoms improve with improvement in AKI 3 Cont. hydration with bicarb  Rebecca Tate C 01/20/2016, 5:01 PM

## 2016-01-20 NOTE — Progress Notes (Signed)
PROGRESS NOTE  Rebecca Tate [id number] DOB: [birth year] DOA: 01/18/2016 PCP: Kandice Hams, MD  HPI/Recap of past 24 hours: Still with nausea, vomiting.  Diarrhea resolved, no further BM since Sunday. She has not been eating,.   Assessment/Plan: Active Problems:   AAA (abdominal aortic aneurysm) without rupture (HCC)   Abdominal pain   Diarrhea   Nausea & vomiting   CKD (chronic kidney disease) stage 5, GFR less than 15 ml/min (HCC)   Leukocytosis   UTI (lower urinary tract infection)  UTI -Continue Cipro pending culture +ecoli, sensitive to cipro.  -WBC trending down.  -Nausea persist, and vomiting. Will check renal US.   Nausea, vomiting;  -Treated supportively with IV fluids and anti-emetics. -still with nausea, diarrhea has resolved. No BM since Sunday. Will check KUB.  -Unclear if nausea, vomiting is related to renal failure. Nephrology consulted.   Acute on chronic kidney disease stage V -Per patient her baseline creatinine is around 5, she is followed by Dr. Elroy Channel. -Patient's creatinine on arrival was 8.16, with signs of metabolic acidosis with a bicarbonate of 18.  - acute worsening is due to prerenal azotemia with volume depletion from GI losses and decreased oral intake. And UTI -s/p gentle hydration  -She has a mature fistula in her right arm. -cr trending down, but patient still with nausea, vomiting, decrease appetite. Will consult nephrology.   Leukocytosis -Likely secondary to acute viral illness, dehydration and UTI, monitor trend.  AAA -No change in size, no signs of imminent rupture, although with abnormal CT scan patient will be transferred to [facility] for vascular surgery evaluation. -no surgical intervention per vascular surgery.   Hypothyroidism - TSH 1.6, continue home dose of Synthroid  DVT prophylaxis -We'll only place on SCDs given abnormal AAA with possible signs of early dissection on CT scan.  CODE STATUS -Full code,  admitting MD discussed code status with patient and 2 sisters Stanton Kidney and [place] at bedside.   Family Communication: patient.   Disposition Plan: home when tolerating diet.     Consultants:  Vascular surgery  Procedures:  none  Antibiotics:  cipro   Objective: BP 99/68 mmHg  Pulse 87  Temp(Src) 98.3 F (36.8 C) (Oral)  Resp 18  Ht 5\' 5"  (1.651 m)  Wt 53.978 kg (119 lb)  BMI 19.80 kg/m2  SpO2 98%  Intake/Output Summary (Last 24 hours) at 01/20/16 1146 Last data filed at 01/20/16 0900  Gross per 24 hour  Intake    480 ml  Output      0 ml  Net    480 ml   Filed Weights   01/18/16 0138 01/20/16 0411  Weight: 54.885 kg (121 lb) 53.978 kg (119 lb)    Exam:   General:  NAD  Cardiovascular: RRR  Respiratory: CTABL  Abdomen: minimal tender suprapubic, otherwise unremarkable, positive BS  Musculoskeletal: No Edema  Neuro: aaox3  Data Reviewed: Basic Metabolic Panel:  Recent Labs Lab 01/18/16 0153 01/19/16 1100 01/20/16 0420  NA 138 140 139  K 3.3* 4.3 3.5  CL 100* 107 105  CO2 18* 16* 18*  GLUCOSE 148* 104* 99  BUN 95* 78* 76*  CREATININE 8.16* 7.23* 6.93*  CALCIUM 8.9 8.5* 8.7*   Liver Function Tests:  Recent Labs Lab 01/18/16 0153  AST 13*  ALT 12*  ALKPHOS 85  BILITOT 0.3  PROT 8.2*  ALBUMIN 3.2*    Recent Labs Lab 01/18/16 0153  LIPASE 21   No results for input(s): AMMONIA in  the last 168 hours. CBC:  Recent Labs Lab 01/18/16 0153 01/19/16 1100 01/20/16 0420  WBC 18.3* 21.1* 19.7*  NEUTROABS 16.5*  --   --   HGB 10.9* 9.8* 9.8*  HCT 32.4* 29.4* 30.3*  MCV 95.6 95.8 94.1  PLT 561* 518* 646*   Cardiac Enzymes:   No results for input(s): CKTOTAL, CKMB, CKMBINDEX, TROPONINI in the last 168 hours. BNP (last 3 results) No results for input(s): BNP in the last 8760 hours.  ProBNP (last 3 results) No results for input(s): PROBNP in the last 8760 hours.  CBG: No results for input(s): GLUCAP in the last 168  hours.  Recent Results (from the past 240 hour(s))  Urine culture     Status: None   Collection Time: 01/18/16  2:55 AM  Result Value Ref Range Status   Specimen Description URINE, CLEAN CATCH  Final   Special Requests NONE  Final   Culture   Final    >=100,000 COLONIES/mL ESCHERICHIA COLI Performed at [facility]    Report Status 01/20/2016 FINAL  Final   Organism ID, Bacteria ESCHERICHIA COLI  Final      Susceptibility   Escherichia coli - MIC*    AMPICILLIN >=32 RESISTANT Resistant     CEFAZOLIN <=4 SENSITIVE Sensitive     CEFTRIAXONE <=1 SENSITIVE Sensitive     CIPROFLOXACIN <=0.25 SENSITIVE Sensitive     GENTAMICIN <=1 SENSITIVE Sensitive     IMIPENEM <=0.25 SENSITIVE Sensitive     NITROFURANTOIN <=16 SENSITIVE Sensitive     TRIMETH/SULFA >=320 RESISTANT Resistant     AMPICILLIN/SULBACTAM 16 INTERMEDIATE Intermediate     PIP/TAZO <=4 SENSITIVE Sensitive     * >=100,000 COLONIES/mL ESCHERICHIA COLI  Urine culture     Status: None   Collection Time: 01/18/16 10:55 PM  Result Value Ref Range Status   Specimen Description URINE, CLEAN CATCH  Final   Special Requests NONE  Final   Culture MULTIPLE SPECIES PRESENT, SUGGEST RECOLLECTION  Final   Report Status 01/20/2016 FINAL  Final     Studies: No results found.  Scheduled Meds: . amLODipine  10 mg Oral Daily  . aspirin EC  81 mg Oral Daily  . calcitRIOL  0.25 mcg Oral Daily  . ciprofloxacin  400 mg Intravenous Q24H  . ezetimibe  10 mg Oral Daily  . feeding supplement (NEPRO CARB STEADY)  237 mL Oral BID BM  . levothyroxine  75 mcg Oral QAC breakfast  . metoprolol succinate  25 mg Oral Daily  . sodium bicarbonate  650 mg Oral BID  . sodium chloride flush  3 mL Intravenous Q12H    Continuous Infusions:    Time spent: 60mins  Niel Hummer A MD.  [facility] Pager [phone]. If 7PM-7AM, please contact night-coverage at [email or web address], password [facility] 01/20/2016, 11:46 AM  LOS: 2 days

## 2016-01-21 ENCOUNTER — Telehealth: Payer: Self-pay | Admitting: Surgery

## 2016-01-21 DIAGNOSIS — N390 Urinary tract infection, site not specified: Principal | ICD-10-CM

## 2016-01-21 DIAGNOSIS — I714 Abdominal aortic aneurysm, without rupture: Secondary | ICD-10-CM

## 2016-01-21 DIAGNOSIS — R112 Nausea with vomiting, unspecified: Secondary | ICD-10-CM

## 2016-01-21 LAB — CBC
HCT: 27.7 % — ABNORMAL LOW (ref 36.0–46.0)
HEMOGLOBIN: 9.1 g/dL — AB (ref 12.0–15.0)
MCH: 30.5 pg (ref 26.0–34.0)
MCHC: 32.9 g/dL (ref 30.0–36.0)
MCV: 93 fL (ref 78.0–100.0)
Platelets: 615 10*3/uL — ABNORMAL HIGH (ref 150–400)
RBC: 2.98 MIL/uL — AB (ref 3.87–5.11)
RDW: 14.7 % (ref 11.5–15.5)
WBC: 18.6 10*3/uL — ABNORMAL HIGH (ref 4.0–10.5)

## 2016-01-21 LAB — RENAL FUNCTION PANEL
ANION GAP: 16 — AB (ref 5–15)
Albumin: 2.3 g/dL — ABNORMAL LOW (ref 3.5–5.0)
BUN: 66 mg/dL — ABNORMAL HIGH (ref 6–20)
CHLORIDE: 101 mmol/L (ref 101–111)
CO2: 23 mmol/L (ref 22–32)
Calcium: 8.2 mg/dL — ABNORMAL LOW (ref 8.9–10.3)
Creatinine, Ser: 6.73 mg/dL — ABNORMAL HIGH (ref 0.44–1.00)
GFR calc non Af Amer: 5 mL/min — ABNORMAL LOW (ref >60)
GFR, EST AFRICAN AMERICAN: 6 mL/min — AB (ref >60)
Glucose, Bld: 120 mg/dL — ABNORMAL HIGH (ref 65–99)
Phosphorus: 5.6 mg/dL — ABNORMAL HIGH (ref 2.5–4.6)
Potassium: 2.8 mmol/L — ABNORMAL LOW (ref 3.5–5.1)
Sodium: 140 mmol/L (ref 135–145)

## 2016-01-21 MED ORDER — SODIUM CHLORIDE 0.9 % IV SOLN
INTRAVENOUS | Status: DC
  Administered 2016-01-21: 21:00:00 via INTRAVENOUS
  Administered 2016-01-21: 75 mL/h via INTRAVENOUS
  Administered 2016-01-22 – 2016-01-23 (×2): via INTRAVENOUS

## 2016-01-21 MED ORDER — POTASSIUM CHLORIDE CRYS ER 20 MEQ PO TBCR
40.0000 meq | EXTENDED_RELEASE_TABLET | Freq: Once | ORAL | Status: AC
  Administered 2016-01-21: 40 meq via ORAL
  Filled 2016-01-21: qty 2

## 2016-01-21 MED ORDER — POTASSIUM CHLORIDE CRYS ER 20 MEQ PO TBCR
40.0000 meq | EXTENDED_RELEASE_TABLET | Freq: Once | ORAL | Status: AC
  Administered 2016-01-21: 40 meq via ORAL

## 2016-01-21 MED ORDER — POTASSIUM CHLORIDE CRYS ER 20 MEQ PO TBCR: 40.0000 meq | EXTENDED_RELEASE_TABLET | Freq: Two times a day (BID) | ORAL | Status: DC

## 2016-01-21 NOTE — Progress Notes (Signed)
PROGRESS NOTE  Rebecca Tate [id number] DOB: 1936-08-28 DOA: 01/18/2016 PCP: Kandice Hams, MD  HPI/Recap of past 24 hours: Feels better today.  No bm yet  Does not want to start dialysis now.   Assessment/Plan: Active Problems:   AAA (abdominal aortic aneurysm) without rupture (HCC)   Abdominal pain   Diarrhea   Nausea & vomiting   CKD (chronic kidney disease) stage 5, GFR less than 15 ml/min (HCC)   Leukocytosis   UTI (lower urinary tract infection)  UTI -Continue Cipro pending culture +ecoli, sensitive to cipro.  -WBC trending down.  Renal US negative for pyelo  Nausea, vomiting;  -Treated supportively with IV fluids and anti-emetics. -still with nausea, diarrhea has resolved. No BM since Sunday. KUB negative for obstruction.  Continue with treatment of UTI  Acute on chronic kidney disease stage V -Per patient her baseline creatinine is around 5, she is followed by Dr. Elroy Channel. -Patient's creatinine on arrival was 8.16, with signs of metabolic acidosis with a bicarbonate of 18.  - acute worsening is due to prerenal azotemia with volume depletion from GI losses and decreased oral intake. And UTI -s/p gentle hydration  -She has a mature fistula in her right arm. -cr trending down, continue with IV fluids.   Leukocytosis -Likely secondary to acute viral illness, dehydration and UTI, monitor trend.  AAA -No change in size, no signs of imminent rupture, although with abnormal CT scan [facility] evaluation. -no surgical intervention per vascular surgery.   Hypothyroidism - TSH 1.6, continue home dose of Synthroid  DVT prophylaxis -We'll only place on SCDs given abnormal AAA with possible signs of early dissection on CT scan.  CODE STATUS -Full code, admitting MD discussed code status with patient and 2 sisters Stanton Kidney and [place] at bedside.   Family Communication: patient.   Disposition Plan: home when  tolerating diet.     Consultants:  Vascular surgery  Procedures:  none  Antibiotics:  cipro   Objective: BP 91/44 mmHg  Pulse 78  Temp(Src) 98.6 F (37 C) (Oral)  Resp 17  Ht 5\' 5"  (1.651 m)  Wt 53.978 kg (119 lb)  BMI 19.80 kg/m2  SpO2 99%  Intake/Output Summary (Last 24 hours) at 01/21/16 1716 Last data filed at 01/21/16 1401  Gross per 24 hour  Intake    480 ml  Output    500 ml  Net    -20 ml   Filed Weights   01/18/16 0138 01/20/16 0411  Weight: 54.885 kg (121 lb) 53.978 kg (119 lb)    Exam:   General:  NAD  Cardiovascular: RRR  Respiratory: CTABL  Abdomen: minimal tender suprapubic, otherwise unremarkable, positive BS  Musculoskeletal: No Edema  Neuro: aaox3  Data Reviewed: Basic Metabolic Panel:  Recent Labs Lab 01/18/16 0153 01/19/16 1100 01/20/16 0420 01/21/16 0350  NA 138 140 139 140  K 3.3* 4.3 3.5 2.8*  CL 100* 107 105 101  CO2 18* 16* 18* 23  GLUCOSE 148* 104* 99 120*  BUN 95* 78* 76* 66*  CREATININE 8.16* 7.23* 6.93* 6.73*  CALCIUM 8.9 8.5* 8.7* 8.2*  PHOS  --   --   --  5.6*   Liver Function Tests:  Recent Labs Lab 01/18/16 0153 01/21/16 0350  AST 13*  --   ALT 12*  --   ALKPHOS 85  --   BILITOT 0.3  --   PROT 8.2*  --   ALBUMIN 3.2* 2.3*  Recent Labs Lab 01/18/16 0153  LIPASE 21   No results for input(s): AMMONIA in the last 168 hours. CBC:  Recent Labs Lab 01/18/16 0153 01/19/16 1100 01/20/16 0420 01/21/16 0350  WBC 18.3* 21.1* 19.7* 18.6*  NEUTROABS 16.5*  --   --   --   HGB 10.9* 9.8* 9.8* 9.1*  HCT 32.4* 29.4* 30.3* 27.7*  MCV 95.6 95.8 94.1 93.0  PLT 561* 518* 646* 615*   Cardiac Enzymes:   No results for input(s): CKTOTAL, CKMB, CKMBINDEX, TROPONINI in the last 168 hours. BNP (last 3 results) No results for input(s): BNP in the last 8760 hours.  ProBNP (last 3 results) No results for input(s): PROBNP in the last 8760 hours.  CBG: No results for input(s): GLUCAP in the last 168  hours.  Recent Results (from the past 240 hour(s))  Urine culture     Status: None   Collection Time: 01/18/16  2:55 AM  Result Value Ref Range Status   Specimen Description URINE, CLEAN CATCH  Final   Special Requests NONE  Final   Culture   Final    >=100,000 COLONIES/mL ESCHERICHIA COLI Performed at [facility]    Report Status 01/20/2016 FINAL  Final   Organism ID, Bacteria ESCHERICHIA COLI  Final      Susceptibility   Escherichia coli - MIC*    AMPICILLIN >=32 RESISTANT Resistant     CEFAZOLIN <=4 SENSITIVE Sensitive     CEFTRIAXONE <=1 SENSITIVE Sensitive     CIPROFLOXACIN <=0.25 SENSITIVE Sensitive     GENTAMICIN <=1 SENSITIVE Sensitive     IMIPENEM <=0.25 SENSITIVE Sensitive     NITROFURANTOIN <=16 SENSITIVE Sensitive     TRIMETH/SULFA >=320 RESISTANT Resistant     AMPICILLIN/SULBACTAM 16 INTERMEDIATE Intermediate     PIP/TAZO <=4 SENSITIVE Sensitive     * >=100,000 COLONIES/mL ESCHERICHIA COLI  Urine culture     Status: None   Collection Time: 01/18/16 10:55 PM  Result Value Ref Range Status   Specimen Description URINE, CLEAN CATCH  Final   Special Requests NONE  Final   Culture MULTIPLE SPECIES PRESENT, SUGGEST RECOLLECTION  Final   Report Status 01/20/2016 FINAL  Final     Studies: No results found.  Scheduled Meds: . aspirin EC  81 mg Oral Daily  . calcitRIOL  0.25 mcg Oral Daily  . ciprofloxacin  400 mg Intravenous Q24H  . ezetimibe  10 mg Oral Daily  . feeding supplement  1 Container Oral TID BM  . levothyroxine  75 mcg Oral QAC breakfast  . metoprolol succinate  25 mg Oral Daily  . sodium bicarbonate  650 mg Oral BID  . sodium chloride flush  3 mL Intravenous Q12H    Continuous Infusions: . sodium chloride 75 mL/hr (01/21/16 0809)     Time spent: 26mins  Niel Hummer A MD.  [facility] Pager [phone]. If 7PM-7AM, please contact night-coverage at [email or web address], password [facility] 01/21/2016, 5:16 PM  LOS: 3 days

## 2016-01-21 NOTE — Progress Notes (Addendum)
Assessment:  1 Stage V CKD with AKI hemodynamically mediated 2 BVT RUE, a little small 3 E. Coli UTI 4 Hypokalemia Plan: 1 Supportive treatment, symptomatic treatment.  2 She could start dialysis but is unwilling at this time 3 K replace 4 She will follow up with her primary nephrologist, Dr. Marval Regal 5 I think the AVF needs intervention, but this can be evaluated later 6 I will sign off  Subjective: Interval History: Still with nausea and vomiting  Objective: Vital signs in last 24 hours: Temp:  [98.7 F (37.1 C)-98.9 F (37.2 C)] 98.9 F (37.2 C) (02/16 0533) Pulse Rate:  [69-85] 85 (02/16 0533) Resp:  [18-19] 18 (02/16 0533) BP: (91-108)/(61-62) 91/62 mmHg (02/16 0533) SpO2:  [95 %-100 %] 95 % (02/16 0533) Weight change:   Intake/Output from previous day: 02/15 0701 - 02/16 0700 In: 480 [P.O.:480] Out: 800 [Urine:800] Intake/Output this shift:    General appearance: alert and cooperative Resp: clear to auscultation bilaterally Chest wall: no tenderness Cardio: regular rate and rhythm, S1, S2 normal, no murmur, click, rub or gallop GI: tender suprapubic region Extremities: AVF RUE with good pulse, but small but usable  Lab Results:  Recent Labs  01/20/16 0420 01/21/16 0350  WBC 19.7* 18.6*  HGB 9.8* 9.1*  HCT 30.3* 27.7*  PLT 646* 615*   BMET:  Recent Labs  01/20/16 0420 01/21/16 0350  NA 139 140  K 3.5 2.8*  CL 105 101  CO2 18* 23  GLUCOSE 99 120*  BUN 76* 66*  CREATININE 6.93* 6.73*  CALCIUM 8.7* 8.2*   No results for input(s): PTH in the last 72 hours. Iron Studies: No results for input(s): IRON, TIBC, TRANSFERRIN, FERRITIN in the last 72 hours. Studies/Results: Dg Abd 1 View  01/20/2016  CLINICAL DATA:  Lower abdominal pain EXAM: ABDOMEN - 1 VIEW COMPARISON:  CT scan 01/18/2016 FINDINGS: There is normal small bowel gas pattern. Contrast material from recent CT scan noted within transverse colon and descending colon. Scattered colonic  diverticula are noted within transverse colon and descending colon. Some colonic gas noted in transverse colon. Left renal artery stent is noted. IMPRESSION: Normal small bowel gas pattern. Residual contrast material noted within transverse colon and distal colon. Colonic diverticula are noted within transverse colon and descending colon. Some colonic gas noted in transverse colon. Electronically Signed   By: Lahoma Crocker M.D.   On: 01/20/2016 12:45   US Renal  01/20/2016  CLINICAL DATA:  Renal failure. EXAM: RENAL / URINARY TRACT ULTRASOUND COMPLETE COMPARISON:  CT, 01/18/2016 and ultrasound, 03/14/2012. FINDINGS: Right Kidney: Length: 8.7 cm. Increased parenchymal echogenicity with cortical thinning. No mass. No hydronephrosis. Left Kidney: Length: 8.6 cm. Increased parenchymal echogenicity. 14 mm upper pole cyst. No other masses. No hydronephrosis. Bladder: Appears normal for degree of bladder distention. IMPRESSION: 1. Findings of medical renal disease with increased parenchymal echogenicity, small renal sizes and cortical thinning. 2. Left upper pole renal cysts which was present on the prior ultrasound. 3. Left kidney has mildly decreased in size the prior ultrasound. No other change. 4. No hydronephrosis. Electronically Signed   By: Lajean Manes M.D.   On: 01/20/2016 15:49   Prior to Admission:  Prescriptions prior to admission  Medication Sig Dispense Refill Last Dose  . amLODipine (NORVASC) 10 MG tablet Take 10 mg by mouth daily.    01/17/2016 at Unknown time  . aspirin EC 81 MG tablet Take 81 mg by mouth daily.   01/17/2016 at Unknown time  .  calcitRIOL (ROCALTROL) 0.25 MCG capsule Take 0.25 mcg by mouth daily.    01/17/2016 at Unknown time  . ezetimibe (ZETIA) 10 MG tablet Take 10 mg by mouth daily.   01/17/2016 at Unknown time  . latanoprost (XALATAN) 0.005 % ophthalmic solution Place 1 drop into both eyes at bedtime.   01/17/2016 at Unknown time  . levothyroxine (SYNTHROID, LEVOTHROID) 75 MCG  tablet Take 75 mcg by mouth daily before breakfast.    01/17/2016 at Unknown time  . metoprolol succinate (TOPROL-XL) 25 MG 24 hr tablet Take 25 mg by mouth daily.    01/17/2016 at 0800     LOS: 3 days   Kanin Lia C 01/21/2016,7:07 AM

## 2016-01-21 NOTE — Telephone Encounter (Signed)
Mailed letter to pts home address, LM with d.c RN at [facility], dpm

## 2016-01-21 NOTE — Telephone Encounter (Signed)
-----   Message from Angelia Mould, MD sent at 01/19/2016  6:15 PM EST ----- Regarding: f/u This pt needs an office visit with Dr. Trula Slade and a duplex of AAA in 3 months. Thanks CD

## 2016-01-21 NOTE — Care Management Important Message (Signed)
Important Message  Patient Details  Name: Rebecca Tate MRN: [id number] Date of Birth: 1936-06-21   Medicare Important Message Given:  Yes    Nathen May 01/21/2016, 12:26 PM

## 2016-01-22 DIAGNOSIS — A09 Infectious gastroenteritis and colitis, unspecified: Secondary | ICD-10-CM

## 2016-01-22 LAB — BASIC METABOLIC PANEL
ANION GAP: 13 (ref 5–15)
BUN: 59 mg/dL — AB (ref 6–20)
CALCIUM: 7.9 mg/dL — AB (ref 8.9–10.3)
CO2: 21 mmol/L — ABNORMAL LOW (ref 22–32)
Chloride: 109 mmol/L (ref 101–111)
Creatinine, Ser: 6.28 mg/dL — ABNORMAL HIGH (ref 0.44–1.00)
GFR calc Af Amer: 7 mL/min — ABNORMAL LOW (ref >60)
GFR, EST NON AFRICAN AMERICAN: 6 mL/min — AB (ref >60)
Glucose, Bld: 72 mg/dL (ref 65–99)
POTASSIUM: 3.9 mmol/L (ref 3.5–5.1)
SODIUM: 143 mmol/L (ref 135–145)

## 2016-01-22 MED ORDER — SODIUM BICARBONATE 650 MG PO TABS
1300.0000 mg | ORAL_TABLET | Freq: Two times a day (BID) | ORAL | Status: DC
  Administered 2016-01-22 – 2016-01-23 (×2): 1300 mg via ORAL
  Filled 2016-01-22 (×2): qty 2

## 2016-01-22 NOTE — Progress Notes (Signed)
Utilization review completed.  

## 2016-01-22 NOTE — Progress Notes (Signed)
PROGRESS NOTE  Rebecca Tate [id number] DOB: 1936/10/05 DOA: 01/18/2016 PCP: Kandice Hams, MD  HPI/Recap of past 24 hours: Feeling better, no abdominal pain.  Nausea better, she will try to eat more.   Assessment/Plan: Active Problems:   AAA (abdominal aortic aneurysm) without rupture (HCC)   Abdominal pain   Diarrhea   Nausea & vomiting   CKD (chronic kidney disease) stage 5, GFR less than 15 ml/min (HCC)   Leukocytosis   UTI (lower urinary tract infection)  UTI -Continue Cipro pending culture +ecoli, sensitive to cipro.  -WBC trending down.  Renal US negative for pyelo  Nausea, vomiting;  -Treated supportively with IV fluids and anti-emetics. -still with nausea, diarrhea has resolved. No BM since Sunday. KUB negative for obstruction.  Continue with treatment of UTI Improving   Acute on chronic kidney disease stage V -Per patient her baseline creatinine is around 5, she is followed by Dr. Coledonato. -Patient\'s creatinine on arrival was 8.16, with signs of metabolic acidosis with a bicarbonate of 18.  - acute worsening is due to prerenal azotemia with volume depletion from GI losses and decreased oral intake. And UTI -s/p gentle hydration  -She has a mature fistula in her right arm. -cr trending down, decrease IV fluids.   Leukocytosis -Likely secondary to acute viral illness, dehydration and UTI, monitor trend.  AAA -No change in size, no signs of imminent rupture, although with abnormal CT scan patient will be transferred to [place] for vascular surgery evaluation. -no surgical intervention per vascular surgery.   Hypothyroidism - TSH 1.6, continue home dose of Synthroid  DVT prophylaxis -We\'ll only place on SCDs given abnormal AAA with possible signs of early dissection on CT scan.  CODE STATUS -Full code, admitting MD discussed code status with patient and 2 sisters Mary and [place] at bedside.   Family Communication: patient.   Disposition  Plan: home when tolerating diet. Home in 24 if cr decreases.      Consultants:  Vascular surgery  Procedures:  none  Antibiotics:  cipro   Objective: BP 97/58 mmHg  Pulse 74  Temp(Src) 97.6 F (36.4 C) (Oral)  Resp 17  Ht 5\' 5" (1.651 m)  Wt 53.978 kg (119 lb)  BMI 19.80 kg/m2  SpO2 100%  Intake/Output Summary (Last 24 hours) at 01/22/16 1657 Last data filed at 01/22/16 0940  Gross per 24 hour  Intake     50 ml  Output      0 ml  Net     50  ml   Filed Weights   01/18/16 0138 01/20/16 0411  Weight: 54.885 kg (121 lb) 53.978 kg (119 lb)    Exam:   General:  NAD  Cardiovascular: RRR  Respiratory: CTABL  Abdomen: Bs present, soft, nt  Musculoskeletal: No Edema  Neuro: aaox3  Data Reviewed: Basic Metabolic Panel:  Recent Labs Lab 01/18/16 0153 01/19/16 1100 01/20/16 0420 01/21/16 0350 01/22/16 0400  NA 138 140 139 140 143  K 3.3* 4.3 3.5 2.8* 3.9  CL 100* 107 105 101 109  CO2 18* 16* 18* 23 21*  GLUCOSE 148* 104* 99 120* 72  BUN 95* 78* 76* 66* 59*  CREATININE 8.16* 7.23* 6.93* 6.73* 6.28*  CALCIUM 8.9 8.5* 8.7* 8.2* 7.9*  PHOS  --   --   --  5.6*  --    Liver Function Tests:  Recent Labs Lab 01/18/16 0153 01/21/16 0350  AST 13*  --   ALT 12*  --  ALKPHOS 85  --   BILITOT 0.3  --   PROT 8.2*  --   ALBUMIN 3.2* 2.3*    Recent Labs Lab 01/18/16 0153  LIPASE 21   No results for input(s): AMMONIA in the last 168 hours. CBC:  Recent Labs Lab 01/18/16 0153 01/19/16 1100 01/20/16 0420 01/21/16 0350  WBC 18.3* 21.1* 19.7* 18.6*  NEUTROABS 16.5*  --   --   --   HGB 10.9* 9.8* 9.8* 9.1*  HCT 32.4* 29.4* 30.3* 27.7*  MCV 95.6 95.8 94.1 93.0  PLT 561* 518* 646* 615*   Cardiac Enzymes:   No results for input(s): CKTOTAL, CKMB, CKMBINDEX, TROPONINI in the last 168 hours. BNP (last 3 results) No results for input(s): BNP in the last 8760 hours.  ProBNP (last 3 results) No results for input(s): PROBNP in the last 8760  hours.  CBG: No results for input(s): GLUCAP in the last 168 hours.  Recent Results (from the past 240 hour(s))  Urine culture     Status: None   Collection Time: 01/18/16  2:55 AM  Result Value Ref Range Status   Specimen Description URINE, CLEAN CATCH  Final   Special Requests NONE  Final   Culture   Final    >=100,000 COLONIES/mL ESCHERICHIA COLI Performed at [facility]    Report Status 01/20/2016 FINAL  Final   Organism ID, Bacteria ESCHERICHIA COLI  Final      Susceptibility   Escherichia coli - MIC*    AMPICILLIN >=32 RESISTANT Resistant     CEFAZOLIN <=4 SENSITIVE Sensitive     CEFTRIAXONE <=1 SENSITIVE Sensitive     CIPROFLOXACIN <=0.25 SENSITIVE Sensitive     GENTAMICIN <=1 SENSITIVE Sensitive     IMIPENEM <=0.25 SENSITIVE Sensitive     NITROFURANTOIN <=16 SENSITIVE Sensitive     TRIMETH/SULFA >=320 RESISTANT Resistant     AMPICILLIN/SULBACTAM 16 INTERMEDIATE Intermediate     PIP/TAZO <=4 SENSITIVE Sensitive     * >=100,000 COLONIES/mL ESCHERICHIA COLI  Urine culture     Status: None   Collection Time: 01/18/16 10:55 PM  Result Value Ref Range Status   Specimen Description URINE, CLEAN CATCH  Final   Special Requests NONE  Final   Culture MULTIPLE SPECIES PRESENT, SUGGEST RECOLLECTION  Final   Report Status 01/20/2016 FINAL  Final     Studies: No results found.  Scheduled Meds: . aspirin EC  81 mg Oral Daily  . calcitRIOL  0.25 mcg Oral Daily  . ciprofloxacin  400 mg Intravenous Q24H  . ezetimibe  10 mg Oral Daily  . feeding supplement  1 Container Oral TID BM  . levothyroxine  75 mcg Oral QAC breakfast  . metoprolol succinate  25 mg Oral Daily  . sodium bicarbonate  1,300 mg Oral BID  . sodium chloride flush  3 mL Intravenous Q12H    Continuous Infusions: . sodium chloride 50 mL/hr at 01/22/16 1316     Time spent: 37mins  Niel Hummer A MD.  [facility] Pager [phone]. If 7PM-7AM, please contact night-coverage at  [email or web address], password [facility] 01/22/2016, 4:57 PM  LOS: 4 days

## 2016-01-23 LAB — CBC
HEMATOCRIT: 27.5 % — AB (ref 36.0–46.0)
HEMOGLOBIN: 8.7 g/dL — AB (ref 12.0–15.0)
MCH: 30.2 pg (ref 26.0–34.0)
MCHC: 31.6 g/dL (ref 30.0–36.0)
MCV: 95.5 fL (ref 78.0–100.0)
Platelets: 660 10*3/uL — ABNORMAL HIGH (ref 150–400)
RBC: 2.88 MIL/uL — AB (ref 3.87–5.11)
RDW: 15 % (ref 11.5–15.5)
WBC: 15.7 10*3/uL — ABNORMAL HIGH (ref 4.0–10.5)

## 2016-01-23 LAB — BASIC METABOLIC PANEL
Anion gap: 15 (ref 5–15)
BUN: 53 mg/dL — AB (ref 6–20)
CHLORIDE: 108 mmol/L (ref 101–111)
CO2: 18 mmol/L — AB (ref 22–32)
Calcium: 8.2 mg/dL — ABNORMAL LOW (ref 8.9–10.3)
Creatinine, Ser: 5.73 mg/dL — ABNORMAL HIGH (ref 0.44–1.00)
GFR calc Af Amer: 7 mL/min — ABNORMAL LOW (ref >60)
GFR calc non Af Amer: 6 mL/min — ABNORMAL LOW (ref >60)
Glucose, Bld: 84 mg/dL (ref 65–99)
POTASSIUM: 3.9 mmol/L (ref 3.5–5.1)
SODIUM: 141 mmol/L (ref 135–145)

## 2016-01-23 MED ORDER — TRAMADOL HCL 50 MG PO TABS: 50.0000 mg | ORAL_TABLET | Freq: Two times a day (BID) | ORAL | Status: AC | PRN

## 2016-01-23 MED ORDER — CIPROFLOXACIN HCL 250 MG PO TABS: 250.0000 mg | ORAL_TABLET | Freq: Every day | ORAL | Status: AC

## 2016-01-23 MED ORDER — SODIUM BICARBONATE 650 MG PO TABS: 1300.0000 mg | ORAL_TABLET | Freq: Two times a day (BID) | ORAL | Status: AC

## 2016-01-23 NOTE — Discharge Summary (Signed)
Physician Discharge Summary  Rebecca Tate [id number] DOB: 1936-08-27 DOA: 01/18/2016  PCP: Kandice Hams, MD  Admit date: 01/18/2016 Discharge date: 01/23/2016  Time spent: 35 minutes  Recommendations for Outpatient Follow-up:  Needs to follow up with nephrology , need B-met to follow renal function CBC to follow hb, might need IV iron.  Follow resolution of infection Follow with vascular for AAA  Discharge Diagnoses:    Leukocytosis   UTI (lower urinary tract infection)   AAA (abdominal aortic aneurysm) without rupture (HCC)   Abdominal pain   Diarrhea   Nausea & vomiting   CKD (chronic kidney disease) stage 5, GFR less than 15 ml/min [facility])     Discharge Condition: stable  Diet recommendation: renal diet  Filed Weights   01/18/16 0138 01/20/16 0411  Weight: 54.885 kg (121 lb) 53.978 kg (119 lb)    History of present illness:  Patient is a 22 H old woman with multiple medical comorbidities including stage V chronic kidney disease, hypothyroidism, AAA, hypertension, hyperlipidemia, left renal artery stenosis status post stenting who presents to the hospital with a 2 day history of lower abdominal pain with nausea and diarrhea. She has not had any further episodes of emesis since this morning. Upon arrival to the emergency department she was found to have a UTI, labs are significant for a worsening of her chronic kidney disease with a creatinine of 8.16, bicarbonate of 18, potassium 3.3, WBC count of 18.3. Because of her history of a AAA CT scan was obtained, without contrast given her chronic kidney disease, that showed an infrarenal abdominal aortic aneurysm measuring 4.5 cm with possible displaced intimal calcification which could indicate dissection, no retroperitoneal hematoma. CT scan was discussed by EDP with Dr. Doren Custard with vascular surgery who thought that there was no change in size of the patient's aneurysm and no signs of imminent rupture, he did however suggest  patient be transferred to [facility] for vascular surgery to evaluate. I have been asked to admit her for further evaluation and management.  Hospital Course:  UTI -Continue Cipro pending culture +ecoli, sensitive to cipro.  -WBC trending down.  Renal US negative for pyelo 7 more days of antibiotics.   Nausea, vomiting;  -Treated supportively with IV fluids and anti-emetics. -still with nausea, diarrhea has resolved. No BM since Sunday. KUB negative for obstruction.  Continue with treatment of UTI Resolved   Acute on chronic kidney disease stage V -Per patient her baseline creatinine is around 5, she is followed by Dr. Elroy Channel. -Patient's creatinine on arrival was 8.16, with signs of metabolic acidosis with a bicarbonate of 18.  - acute worsening is due to prerenal azotemia with volume depletion from GI losses and decreased oral intake. And UTI -s/p gentle hydration  -She has a mature fistula in her right arm. -cr trending down, decrease IV fluids.  Stable at 5.   Leukocytosis -Likely secondary to acute viral illness, dehydration and UTI, monitor trend.  AAA -No change in size, no signs of imminent rupture, although with abnormal CT scan patient will be transferred to [facility] for vascular surgery evaluation. -no surgical intervention per vascular surgery.   Hypothyroidism - TSH 1.6, continue home dose of Synthroid  DVT prophylaxis -We'll only place on SCDs given abnormal AAA with possible signs of early dissection on CT scan.  CODE STATUS -Full code, admitting MD discussed code status with patient and 2 sisters [facility] and [place] at bedside.  Procedures:    Consultations:  Nephrology  Discharge Exam: Filed Vitals:   01/22/16 2026 01/23/16 0552  BP: 99/51 98/60  Pulse: 76 86  Temp: 98.3 F (36.8 C) 98.3 F (36.8 C)  Resp: 16 18    General: NAD Cardiovascular: S 1, S 2 RRR Respiratory: CTA  Discharge Instructions   Discharge Instructions     Diet - low sodium heart healthy    Complete by:  As directed      Increase activity slowly    Complete by:  As directed           Current Discharge Medication List    START taking these medications   Details  ciprofloxacin (CIPRO) 250 MG tablet Take 1 tablet (250 mg total) by mouth daily. Qty: 12 tablet, Refills: 0    sodium bicarbonate 650 MG tablet Take 2 tablets (1,300 mg total) by mouth 2 (two) times daily. Qty: 60 tablet, Refills: 0    traMADol (ULTRAM) 50 MG tablet Take 1-2 tablets (50-100 mg total) by mouth every 12 (twelve) hours as needed for moderate pain or severe pain. Qty: 30 tablet, Refills: 0      CONTINUE these medications which have NOT CHANGED   Details  aspirin EC 81 MG tablet Take 81 mg by mouth daily.    calcitRIOL (ROCALTROL) 0.25 MCG capsule Take 0.25 mcg by mouth daily.     ezetimibe (ZETIA) 10 MG tablet Take 10 mg by mouth daily.    latanoprost (XALATAN) 0.005 % ophthalmic solution Place 1 drop into both eyes at bedtime.    levothyroxine (SYNTHROID, LEVOTHROID) 75 MCG tablet Take 75 mcg by mouth daily before breakfast.     metoprolol succinate (TOPROL-XL) 25 MG 24 hr tablet Take 25 mg by mouth daily.       STOP taking these medications     amLODipine (NORVASC) 10 MG tablet        Allergies  Allergen Reactions  . Hydralazine Other (See Comments)    Liver inflamed  . Morphine And Related Anxiety    Makes me crazy  . Penicillins Hives and Itching    Has patient had a PCN reaction causing immediate rash, facial/tongue/throat swelling, SOB or lightheadedness with hypotension: Yes Has patient had a PCN reaction causing severe rash involving mucus membranes or skin necrosis: No Has patient had a PCN reaction that required hospitalization No Has patient had a PCN reaction occurring within the last 10 years: No If all of the above answers are "NO", then may proceed with Cephalosporin use.     Follow-up Information    Follow up with  Kandice Hams, MD.   Specialty:  Internal Medicine   Contact information:   [address] [place] [address] [phone]       Follow up with Donetta Potts, MD In 1 week.   Specialty:  Nephrology   Contact information:   [place] [address] [phone]        The results of significant diagnostics from this hospitalization (including imaging, microbiology, ancillary and laboratory) are listed below for reference.    Significant Diagnostic Studies: Ct Abdomen Pelvis Wo Contrast  01/18/2016  CLINICAL DATA:  Sharp constant bilateral lower abdominal pain for 3 days. Diarrhea and weakness. EXAM: CT ABDOMEN AND PELVIS WITHOUT CONTRAST TECHNIQUE: Multidetector CT imaging of the abdomen and pelvis was performed following the standard protocol without IV contrast. COMPARISON:  None. FINDINGS: Atelectasis in the lung bases. Mild pericardial thickening. Coronary artery and aortic calcifications. Evaluation of solid  organs and vascular structures is limited without IV contrast material. The unenhanced appearance of the liver, pancreas, spleen, adrenal glands, inferior vena cava, and retroperitoneal lymph nodes is unremarkable. Infrarenal abdominal aortic aneurysm measuring 4.5 cm AP dimension and 4.8 cm transverse dimension. Diffuse calcification of the abdominal aorta. Internal calcifications within the abdominal aortic aneurysm may be due to dystrophic calcification or could indicate displaced intimal calcifications. Aortic dissection is not excluded. No retroperitoneal fluid collection to suggest rupture or hematoma. Two stones in the right renal pelvis, largest measuring 3 mm diameter. No hydronephrosis or hydroureter. No ureteral stones identified. Cholelithiasis without inflammatory change. Stomach, small bowel, and colon are not abnormally distended and contrast material flows through to the colon suggesting no evidence of bowel obstruction. No bowel wall  thickening is appreciated. No free air or free fluid in the abdomen. Pelvis: Bladder wall is not thickened. Uterus is surgically absent. No pelvic mass or lymphadenopathy. No free or loculated pelvic fluid collections. 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 Degenerative changes in the spine. No destructive bone lesions. Probable calcific stenosis of the iliac and external iliac arteries bilaterally. IMPRESSION: Infrarenal abdominal aortic aneurysm measuring 4.5 cm maximal diameter. Possible displaced intimal calcification could indicate dissection. No retroperitoneal hematoma. Cholelithiasis. Nonobstructing right renal stones. Electronically Signed   By: Burman Nieves M.D.   On: 01/18/2016 05:09   Dg Abd 1 View  01/20/2016  CLINICAL DATA:  Lower abdominal pain EXAM: ABDOMEN - 1 VIEW COMPARISON:  CT scan 01/18/2016 FINDINGS: There is normal small bowel gas pattern. Contrast material from recent CT scan noted within transverse colon and descending colon. Scattered colonic diverticula are noted within transverse colon and descending colon. Some colonic gas noted in transverse colon. Left renal artery stent is noted. IMPRESSION: Normal small bowel gas pattern. Residual contrast material noted within transverse colon and distal colon. Colonic diverticula are noted within transverse colon and descending colon. Some colonic gas noted in transverse colon. Electronically Signed   By: Natasha Mead M.D.   On: 01/20/2016 12:45   Ct Head Wo Contrast  01/13/2016  CLINICAL DATA:  Right-sided headache, 4 days duration. EXAM: CT HEAD WITHOUT CONTRAST TECHNIQUE: Contiguous axial images were obtained from the base of the skull through the vertex without intravenous contrast. COMPARISON:  MRI 01/16/2007 FINDINGS: The brain does not show advanced atrophy. There is no evidence of old or acute focal small or large vessel infarction, mass lesion, hemorrhage, hydrocephalus or extra-axial collection. The calvarium is unremarkable.  Sinuses, middle ears and mastoids are clear. There is atherosclerotic calcification of the major vessels at the base of the brain. IMPRESSION: Normal appearance of the brain for age. No cause of headache identified. Atherosclerotic calcification of the major vessels at the base of the brain. Electronically Signed   By: Paulina Fusi M.D.   On: 01/13/2016 15:23   US Renal  01/20/2016  CLINICAL DATA:  Renal failure. EXAM: RENAL / URINARY TRACT ULTRASOUND COMPLETE COMPARISON:  CT, 01/18/2016 and ultrasound, 03/14/2012. FINDINGS: Right Kidney: Length: 8.7 cm. Increased parenchymal echogenicity with cortical thinning. No mass. No hydronephrosis. Left Kidney: Length: 8.6 cm. Increased parenchymal echogenicity. 14 mm upper pole cyst. No other masses. No hydronephrosis. Bladder: Appears normal for degree of bladder distention. IMPRESSION: 1. Findings of medical renal disease with increased parenchymal echogenicity, small renal sizes and cortical thinning. 2. Left upper pole renal cysts which was present on the prior ultrasound. 3. Left kidney has mildly decreased in size the prior ultrasound. No other change. 4.  No hydronephrosis. Electronically Signed   By: Lajean Manes M.D.   On: 01/20/2016 15:49    Microbiology: Recent Results (from the past 240 hour(s))  Urine culture     Status: None   Collection Time: 01/18/16  2:55 AM  Result Value Ref Range Status   Specimen Description URINE, CLEAN CATCH  Final   Special Requests NONE  Final   Culture   Final    >=100,000 COLONIES/mL ESCHERICHIA COLI Performed at [facility]    Report Status 01/20/2016 FINAL  Final   Organism ID, Bacteria ESCHERICHIA COLI  Final      Susceptibility   Escherichia coli - MIC*    AMPICILLIN >=32 RESISTANT Resistant     CEFAZOLIN <=4 SENSITIVE Sensitive     CEFTRIAXONE <=1 SENSITIVE Sensitive     CIPROFLOXACIN <=0.25 SENSITIVE Sensitive     GENTAMICIN <=1 SENSITIVE Sensitive     IMIPENEM <=0.25 SENSITIVE Sensitive      NITROFURANTOIN <=16 SENSITIVE Sensitive     TRIMETH/SULFA >=320 RESISTANT Resistant     AMPICILLIN/SULBACTAM 16 INTERMEDIATE Intermediate     PIP/TAZO <=4 SENSITIVE Sensitive     * >=100,000 COLONIES/mL ESCHERICHIA COLI  Urine culture     Status: None   Collection Time: 01/18/16 10:55 PM  Result Value Ref Range Status   Specimen Description URINE, CLEAN CATCH  Final   Special Requests NONE  Final   Culture MULTIPLE SPECIES PRESENT, SUGGEST RECOLLECTION  Final   Report Status 01/20/2016 FINAL  Final     Labs: Basic Metabolic Panel:  Recent Labs Lab 01/19/16 1100 01/20/16 0420 01/21/16 0350 01/22/16 0400 01/23/16 0341  NA 140 139 140 143 141  K 4.3 3.5 2.8* 3.9 3.9  CL 107 105 101 109 108  CO2 16* 18* 23 21* 18*  GLUCOSE 104* 99 120* 72 84  BUN 78* 76* 66* 59* 53*  CREATININE 7.23* 6.93* 6.73* 6.28* 5.73*  CALCIUM 8.5* 8.7* 8.2* 7.9* 8.2*  PHOS  --   --  5.6*  --   --    Liver Function Tests:  Recent Labs Lab 01/18/16 0153 01/21/16 0350  AST 13*  --   ALT 12*  --   ALKPHOS 85  --   BILITOT 0.3  --   PROT 8.2*  --   ALBUMIN 3.2* 2.3*    Recent Labs Lab 01/18/16 0153  LIPASE 21   No results for input(s): AMMONIA in the last 168 hours. CBC:  Recent Labs Lab 01/18/16 0153 01/19/16 1100 01/20/16 0420 01/21/16 0350 01/23/16 0341  WBC 18.3* 21.1* 19.7* 18.6* 15.7*  NEUTROABS 16.5*  --   --   --   --   HGB 10.9* 9.8* 9.8* 9.1* 8.7*  HCT 32.4* 29.4* 30.3* 27.7* 27.5*  MCV 95.6 95.8 94.1 93.0 95.5  PLT 561* 518* 646* 615* 660*   Cardiac Enzymes: No results for input(s): CKTOTAL, CKMB, CKMBINDEX, TROPONINI in the last 168 hours. BNP: BNP (last 3 results) No results for input(s): BNP in the last 8760 hours.  ProBNP (last 3 results) No results for input(s): PROBNP in the last 8760 hours.  CBG: No results for input(s): GLUCAP in the last 168 hours.     Signed:  Elmarie Shiley MD.  [facility] 01/23/2016, 12:48 PM

## 2016-01-23 NOTE — Progress Notes (Signed)
Prepared for d/c including reviewed AVS printout and prescriptions with patient and her two sisters including medications, prescriptions, appointments and additional information - all questions answered. Questions r/t BP medications - clarified and called Dr. Tyrell Antonio - stated is to continue taking Toprol-XL daily - informed patient and sisters. This RN took patient to [place] [facility] exit via w/c with sister for d/c home.

## 2016-02-03 DEATH — deceased

## 2016-03-29 ENCOUNTER — Encounter: Payer: Self-pay | Admitting: Surgery

## 2016-03-31 ENCOUNTER — Encounter: Payer: Self-pay | Admitting: Surgery

## 2016-04-04 ENCOUNTER — Ambulatory Visit: Payer: Self-pay | Admitting: Surgery

## 2016-04-04 ENCOUNTER — Inpatient Hospital Stay (HOSPITAL_COMMUNITY): Admission: RE | Admit: 2016-04-04 | Payer: Self-pay | Source: Ambulatory Visit

## 2016-04-18 ENCOUNTER — Ambulatory Visit: Payer: Medicare Other | Admitting: Surgery

## 2016-04-18 ENCOUNTER — Other Ambulatory Visit (HOSPITAL_COMMUNITY): Payer: Medicare Other

## 2016-09-19 ENCOUNTER — Encounter (HOSPITAL_COMMUNITY): Payer: Medicare Other

## 2016-09-19 ENCOUNTER — Ambulatory Visit: Payer: Medicare Other | Admitting: Family

## 2016-10-17 ENCOUNTER — Encounter (HOSPITAL_COMMUNITY): Payer: Medicare Other

## 2016-10-17 ENCOUNTER — Ambulatory Visit: Payer: Medicare Other | Admitting: Family

## 2016-10-17 ENCOUNTER — Other Ambulatory Visit (HOSPITAL_COMMUNITY): Payer: Medicare Other

## 2017-04-07 IMAGING — CT CT ABD-PELV W/O CM
2 of 4 series · 15 of 46 positions shown, 17 images · non-contrast
Comparison: None.

CLINICAL DATA: Sharp constant bilateral lower abdominal pain for 3
days. Diarrhea and weakness.

EXAM:
CT ABDOMEN AND PELVIS WITHOUT CONTRAST
TECHNIQUE: Multidetector CT imaging of the abdomen and pelvis was performed
following the standard protocol without IV contrast.

[Series 2: abdomen/pelvis w/o contrast · axial · non-contrast · 0.57mm/px · z∈[+446,+796]mm · 12 of 78 slices shown, 14 images]
[im 4/78  soft-tissue]
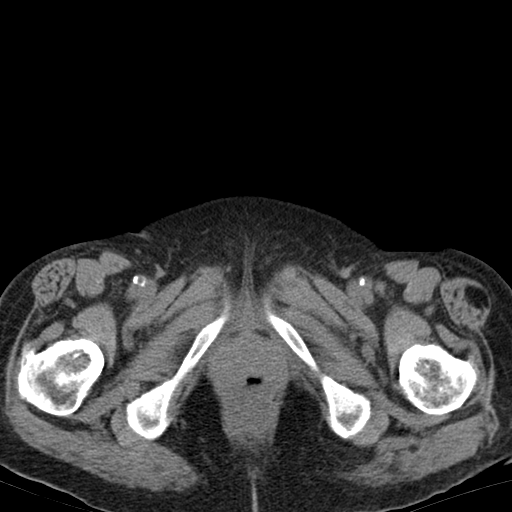
[im 4/78  bone]
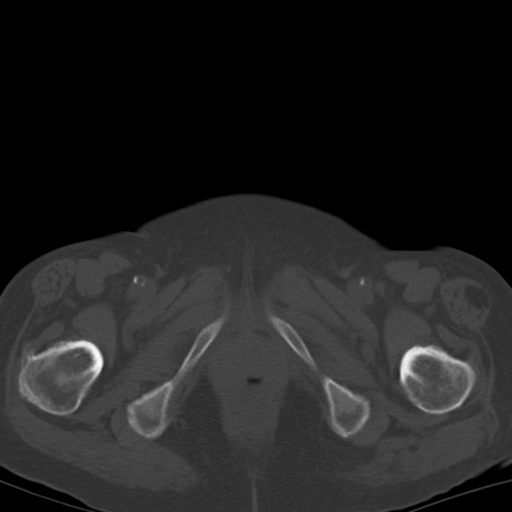
[im 11/78  soft-tissue]
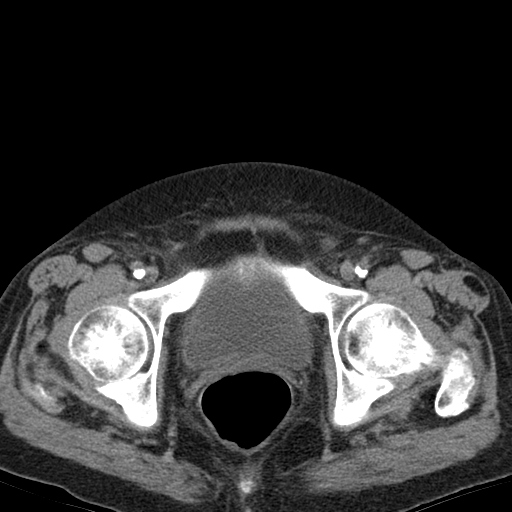
[im 18/78  soft-tissue]
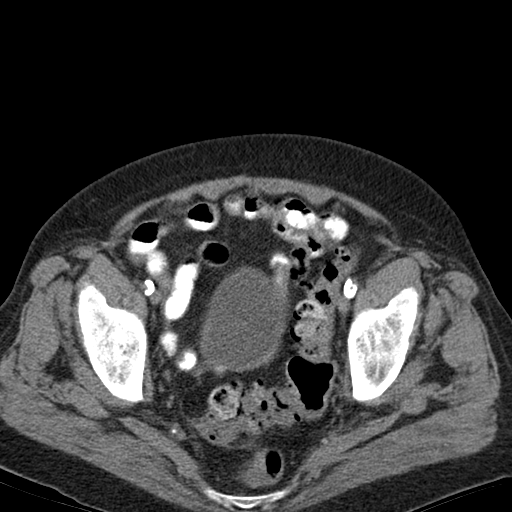
[im 25/78  soft-tissue]
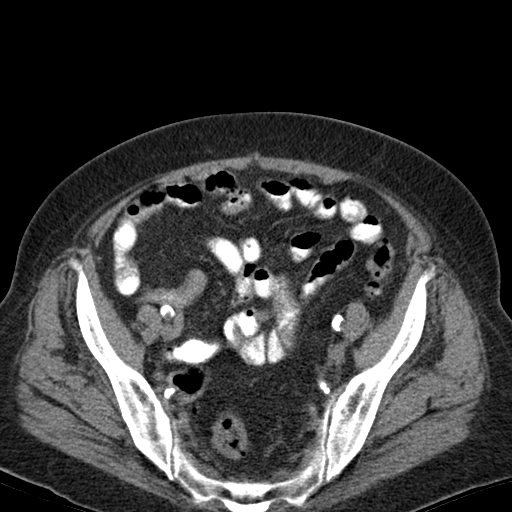
[im 29/78  soft-tissue]
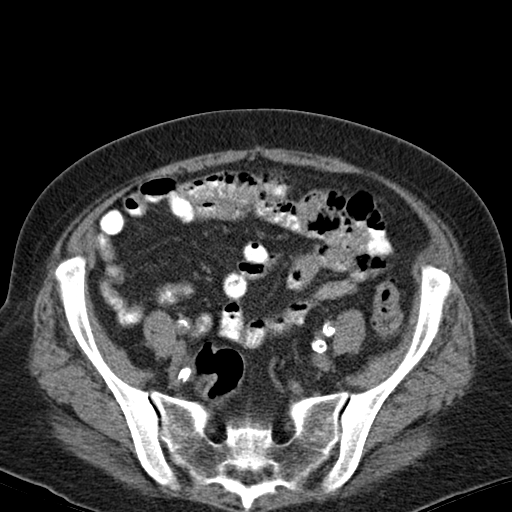
[im 36/78  soft-tissue]
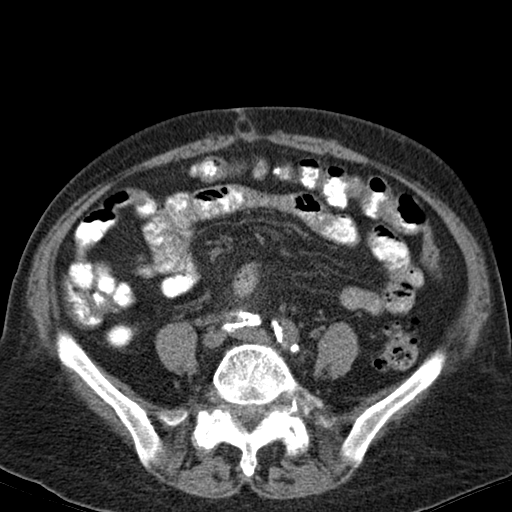
[im 43/78  soft-tissue]
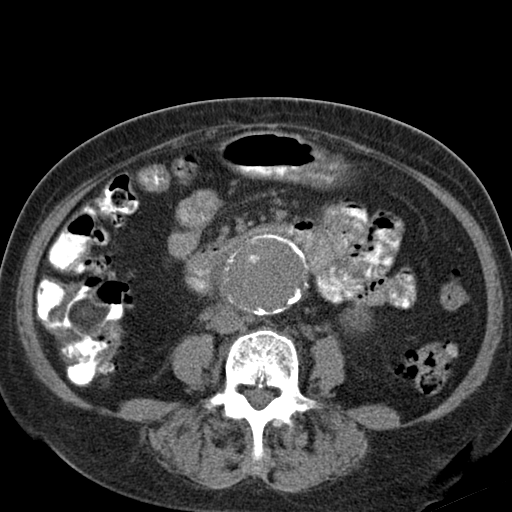
[im 50/78  soft-tissue]
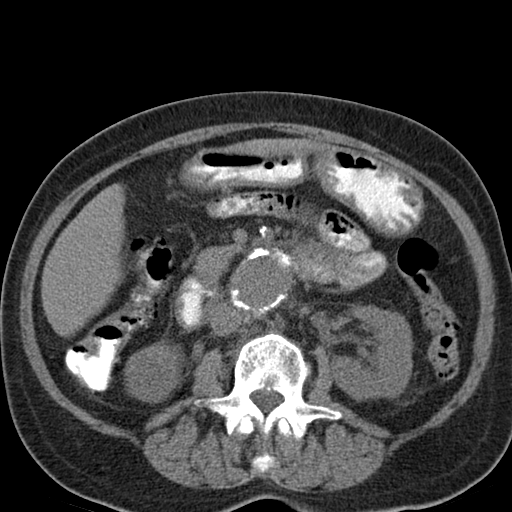
[im 53/78  soft-tissue]
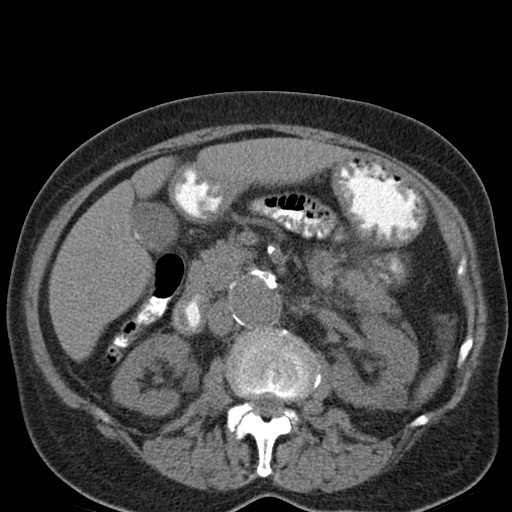
[im 53/78  bone]
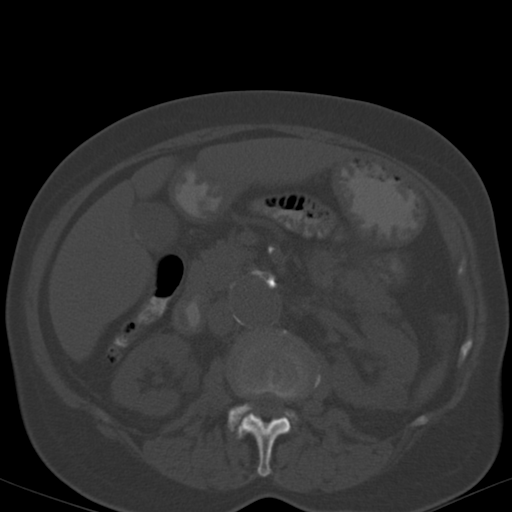
[im 60/78  soft-tissue]
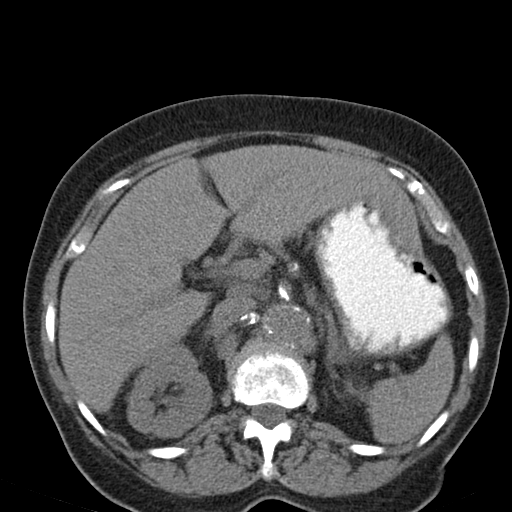
[im 67/78  soft-tissue]
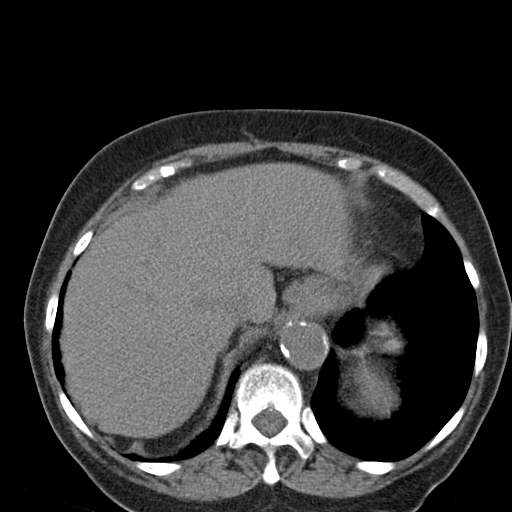
[im 74/78  soft-tissue]
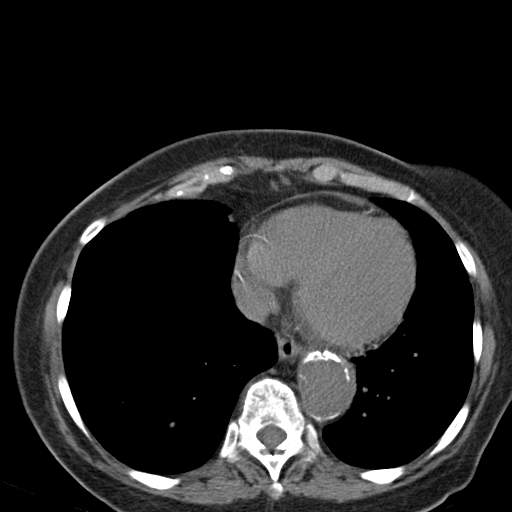

[Series 3: mpr cor 3.0mm · coronal · 0.60mm/px · 3 of 91 slices shown]
[im 31/91  soft-tissue]
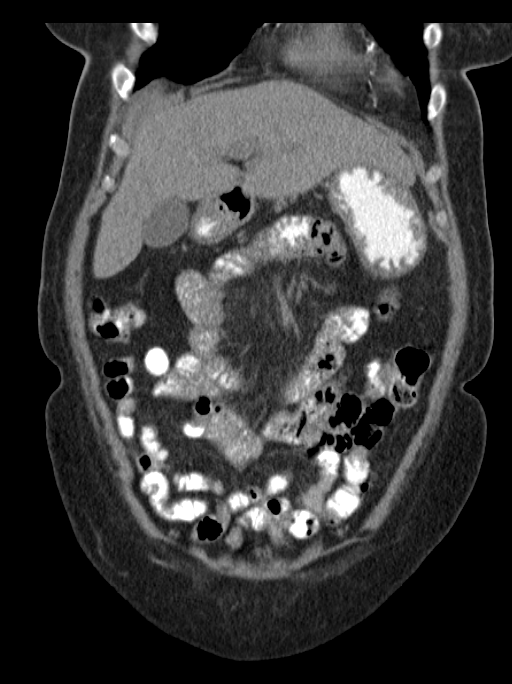
[im 41/91  soft-tissue]
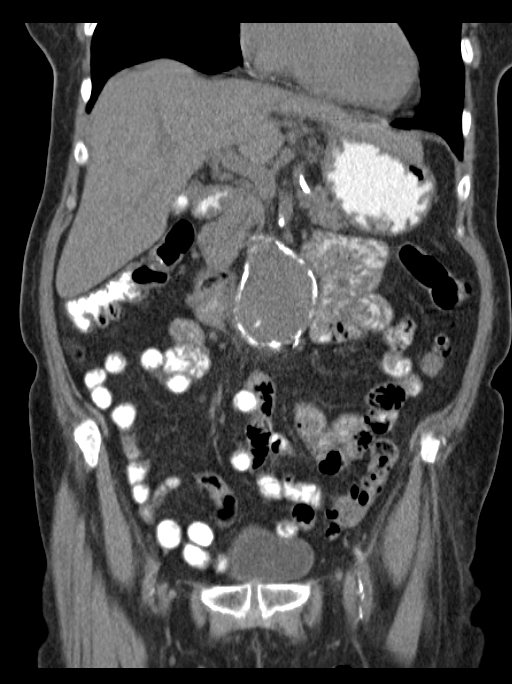
[im 51/91  soft-tissue]
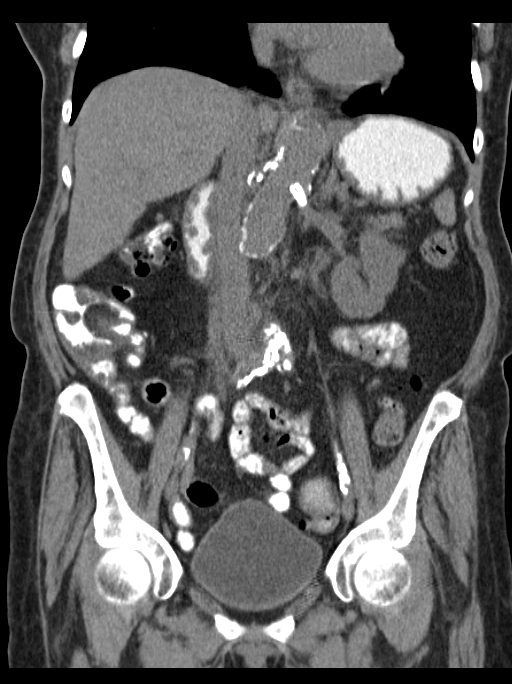

[15 of 46 positions shown; findings below may reference images not displayed]

FINDINGS: Atelectasis in the lung bases. Mild pericardial thickening. Coronary
artery and aortic calcifications.

Evaluation of solid organs and vascular structures is limited
without IV contrast material. The unenhanced appearance of the
liver, pancreas, spleen, adrenal glands, inferior vena cava, and
retroperitoneal lymph nodes is unremarkable.

Infrarenal abdominal aortic aneurysm measuring 4.5 cm AP dimension
and 4.8 cm transverse dimension. Diffuse calcification of the
abdominal aorta. Internal calcifications within the abdominal aortic
aneurysm may be due to dystrophic calcification or could indicate
displaced intimal calcifications. Aortic dissection is not excluded.
No retroperitoneal fluid collection to suggest rupture or hematoma.
Two stones in the right renal pelvis, largest measuring 3 mm
diameter. No hydronephrosis or hydroureter. No ureteral stones
identified. Cholelithiasis without inflammatory change.

Stomach, small bowel, and colon are not abnormally distended and
contrast material flows through to the colon suggesting no evidence
of bowel obstruction. No bowel wall thickening is appreciated. No
free air or free fluid in the abdomen.

Pelvis: Bladder wall is not thickened. Uterus is surgically absent.
No pelvic mass or lymphadenopathy. No free or loculated pelvic fluid
collections. Intramuscular lipoma at the left hip. Degenerative
changes in the spine. No destructive bone lesions. Probable calcific
stenosis of the iliac and external iliac arteries bilaterally.
IMPRESSION: Infrarenal abdominal aortic aneurysm measuring 4.5 cm maximal
diameter. Possible displaced intimal calcification could indicate
dissection. No retroperitoneal hematoma.

Cholelithiasis.

Nonobstructing right renal stones.
